# Patient Record
Sex: Male | Born: 1960 | Race: White | Hispanic: No | Marital: Married | Smoking: Never smoker
Health system: Southern US, Community
[De-identification: ages and names within clinical notes are randomized; demographics above are authoritative.]

## PROBLEM LIST (undated history)

## (undated) DIAGNOSIS — K219 Gastro-esophageal reflux disease without esophagitis: Secondary | ICD-10-CM

## (undated) DIAGNOSIS — R7989 Other specified abnormal findings of blood chemistry: Secondary | ICD-10-CM

## (undated) DIAGNOSIS — R945 Abnormal results of liver function studies: Secondary | ICD-10-CM

## (undated) DIAGNOSIS — F419 Anxiety disorder, unspecified: Secondary | ICD-10-CM

## (undated) DIAGNOSIS — M545 Low back pain, unspecified: Secondary | ICD-10-CM

## (undated) DIAGNOSIS — E349 Endocrine disorder, unspecified: Secondary | ICD-10-CM

## (undated) DIAGNOSIS — J309 Allergic rhinitis, unspecified: Secondary | ICD-10-CM

## (undated) DIAGNOSIS — M1990 Unspecified osteoarthritis, unspecified site: Secondary | ICD-10-CM

## (undated) DIAGNOSIS — J45909 Unspecified asthma, uncomplicated: Secondary | ICD-10-CM

## (undated) DIAGNOSIS — F909 Attention-deficit hyperactivity disorder, unspecified type: Secondary | ICD-10-CM

## (undated) DIAGNOSIS — M419 Scoliosis, unspecified: Secondary | ICD-10-CM

## (undated) DIAGNOSIS — E785 Hyperlipidemia, unspecified: Secondary | ICD-10-CM

## (undated) HISTORY — DX: Low back pain, unspecified: M54.50

## (undated) HISTORY — DX: Abnormal results of liver function studies: R94.5

## (undated) HISTORY — DX: Anxiety disorder, unspecified: F41.9

## (undated) HISTORY — DX: Scoliosis, unspecified: M41.9

## (undated) HISTORY — DX: Hyperlipidemia, unspecified: E78.5

## (undated) HISTORY — DX: Attention-deficit hyperactivity disorder, unspecified type: F90.9

## (undated) HISTORY — PX: FOOT SURGERY: SHX648

## (undated) HISTORY — DX: Unspecified osteoarthritis, unspecified site: M19.90

## (undated) HISTORY — DX: Allergic rhinitis, unspecified: J30.9

## (undated) HISTORY — DX: Other specified abnormal findings of blood chemistry: R79.89

## (undated) HISTORY — DX: Low back pain: M54.5

## (undated) HISTORY — DX: Endocrine disorder, unspecified: E34.9

## (undated) HISTORY — DX: Gastro-esophageal reflux disease without esophagitis: K21.9

## (undated) HISTORY — DX: Unspecified asthma, uncomplicated: J45.909

---

## 1998-05-13 ENCOUNTER — Ambulatory Visit: Admission: RE | Admit: 1998-05-13 | Discharge: 1998-05-13 | Payer: Self-pay | Admitting: Neurology

## 2000-12-23 ENCOUNTER — Encounter: Payer: Self-pay | Admitting: Pulmonary Disease

## 2000-12-23 ENCOUNTER — Ambulatory Visit (HOSPITAL_COMMUNITY): Admission: RE | Admit: 2000-12-23 | Discharge: 2000-12-23 | Payer: Self-pay | Admitting: Pulmonary Disease

## 2001-08-23 HISTORY — PX: OTHER SURGICAL HISTORY: SHX169

## 2002-06-13 ENCOUNTER — Inpatient Hospital Stay (HOSPITAL_COMMUNITY): Admission: EM | Admit: 2002-06-13 | Discharge: 2002-06-16 | Payer: Self-pay | Admitting: Psychiatry

## 2005-11-23 ENCOUNTER — Ambulatory Visit: Payer: Self-pay | Admitting: Pulmonary Disease

## 2005-11-23 ENCOUNTER — Ambulatory Visit (HOSPITAL_COMMUNITY): Admission: RE | Admit: 2005-11-23 | Discharge: 2005-11-23 | Payer: Self-pay | Admitting: Pulmonary Disease

## 2005-11-23 IMAGING — US US ABDOMEN COMPLETE
1 series · 14 of 25 positions shown · non-contrast
Comparison: none

CLINICAL DATA: Abdominal pain, nausea.
 ABDOMEN ULTRASOUND:
TECHNIQUE: Complete abdominal ultrasound examination was performed including evaluation of the liver, gallbladder, bile ducts, pancreas, kidneys, spleen, IVC, and abdominal aorta.
 No comparison.

[Series 1: unknown · 0.37mm/px · 14 of 84 slices shown]
[im 1/84]
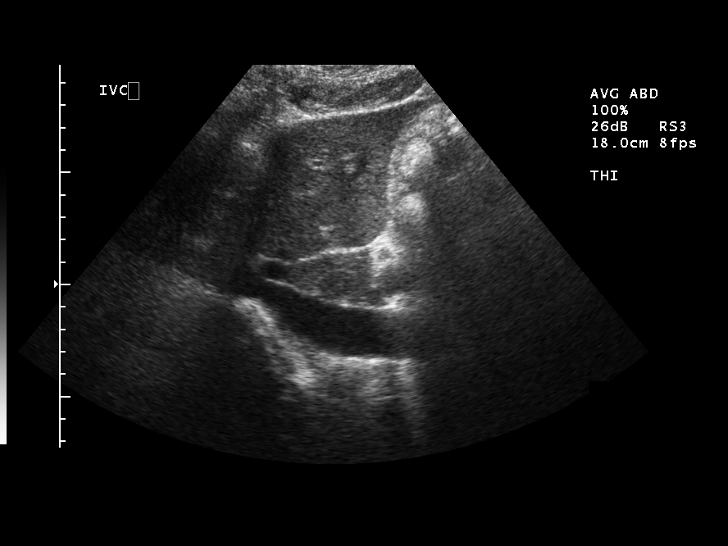
[im 7/84]
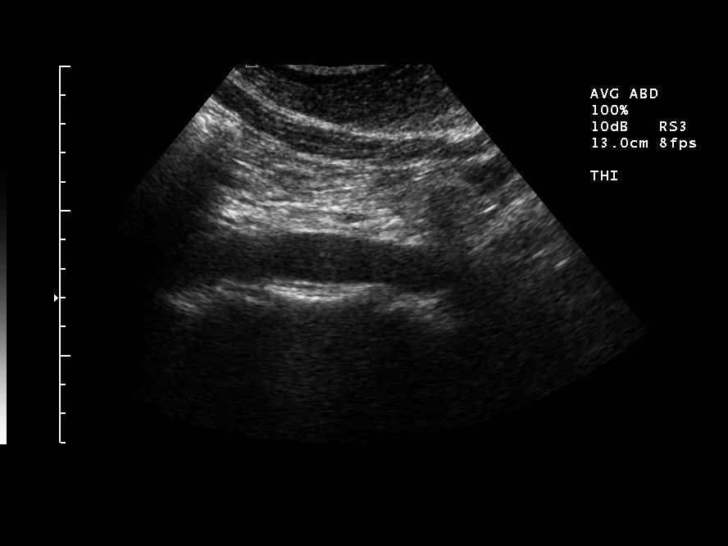
[im 14/84]
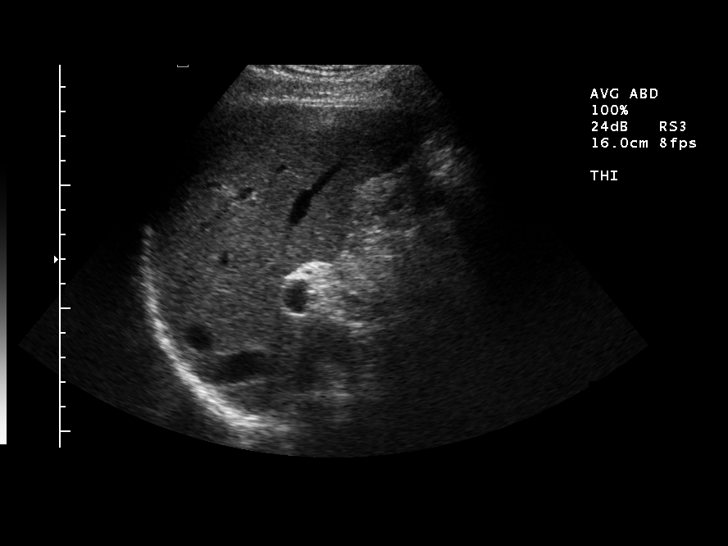
[im 21/84]
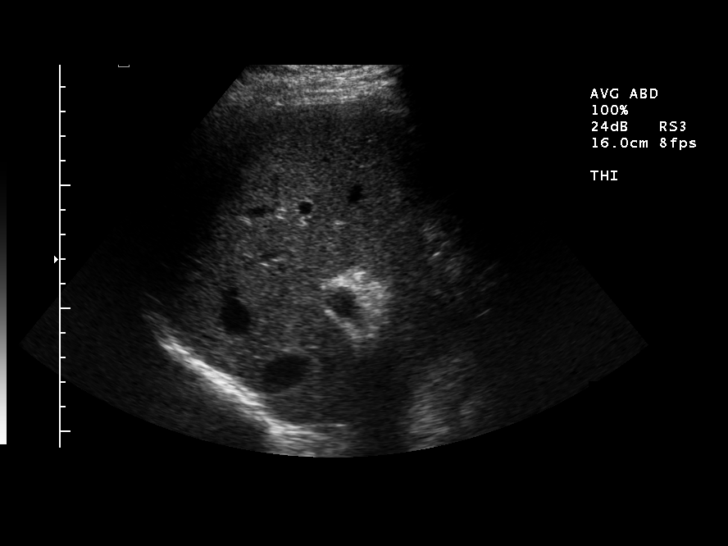
[im 28/84]
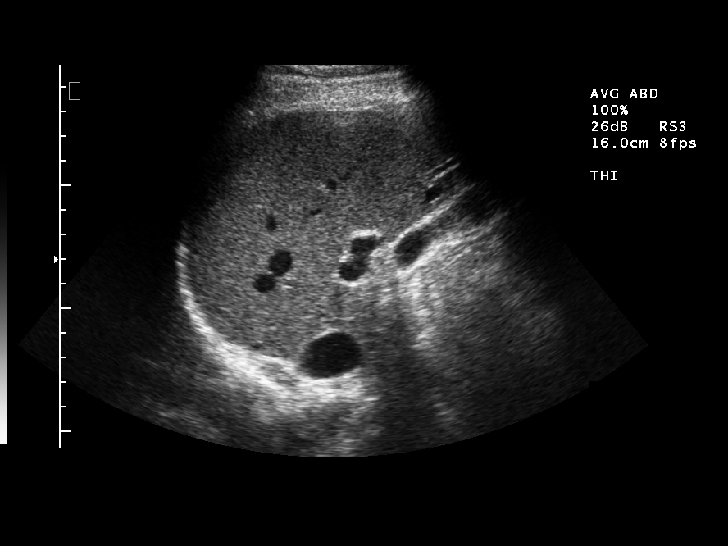
[im 32/84]
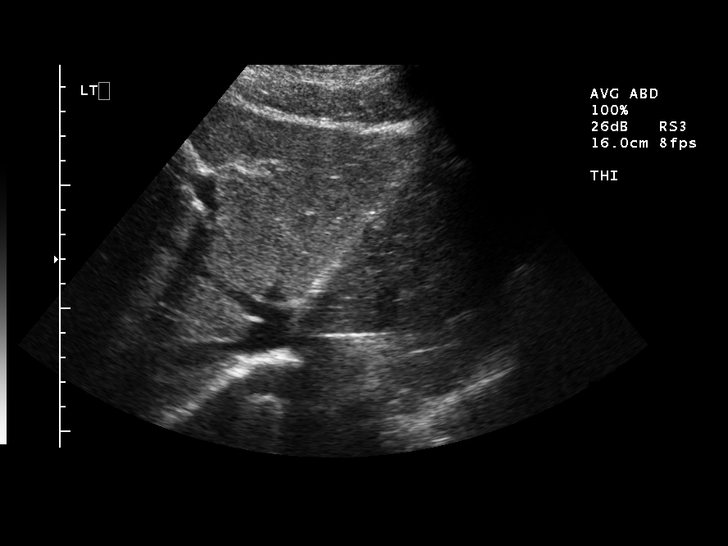
[im 39/84]
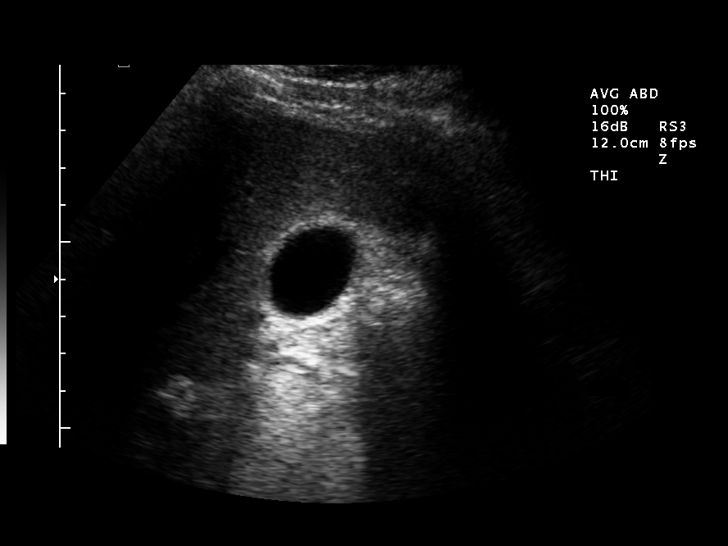
[im 45/84]
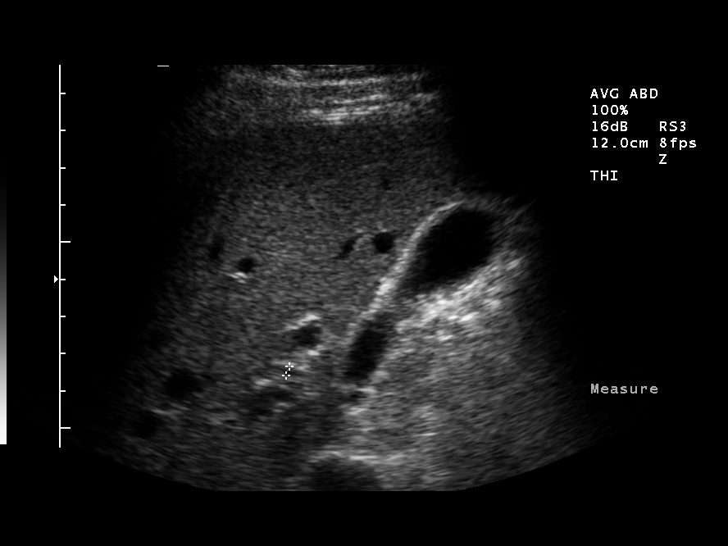
[im 52/84]
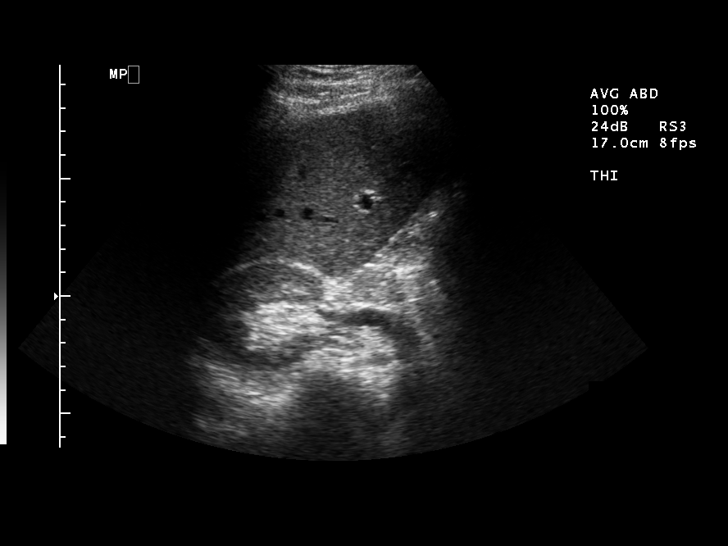
[im 56/84]
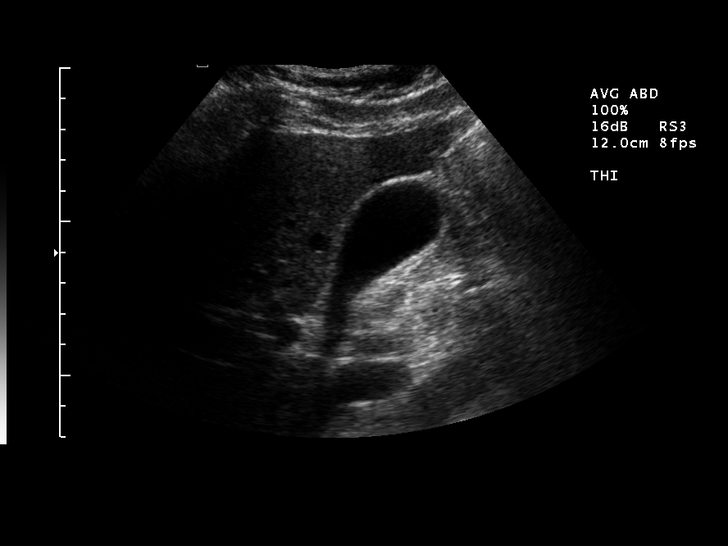
[im 63/84]
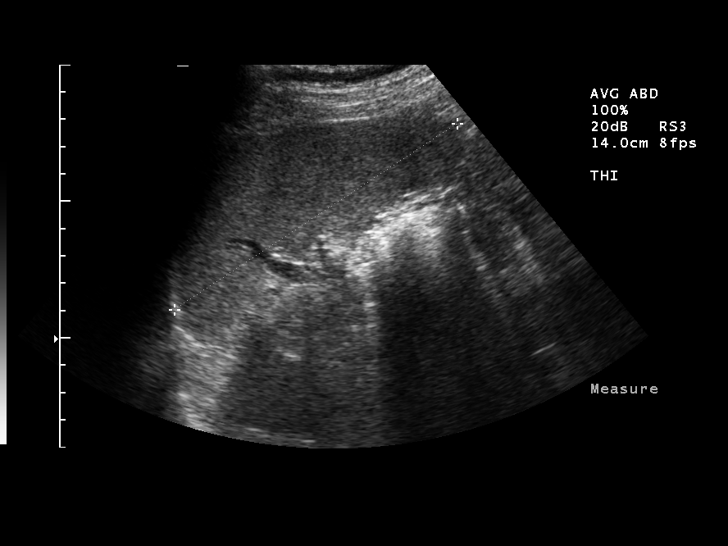
[im 70/84]
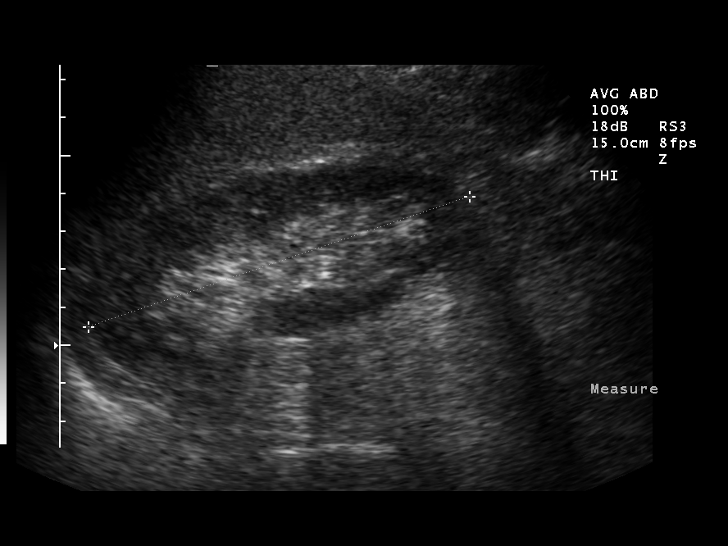
[im 77/84]
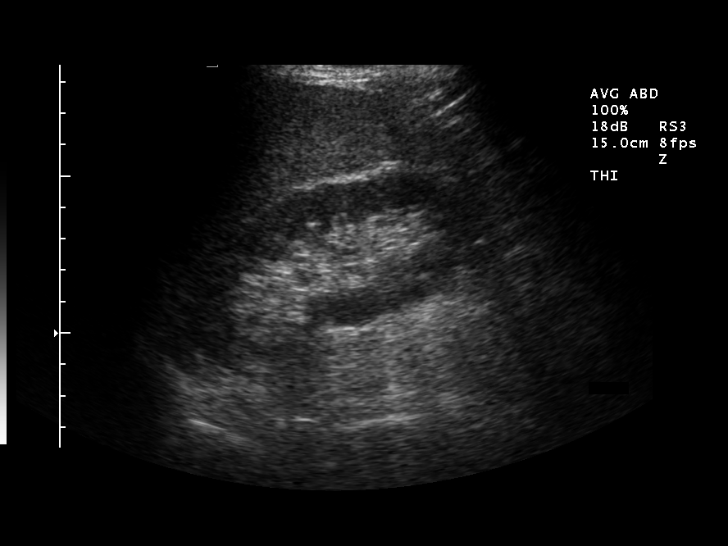
[im 84/84]
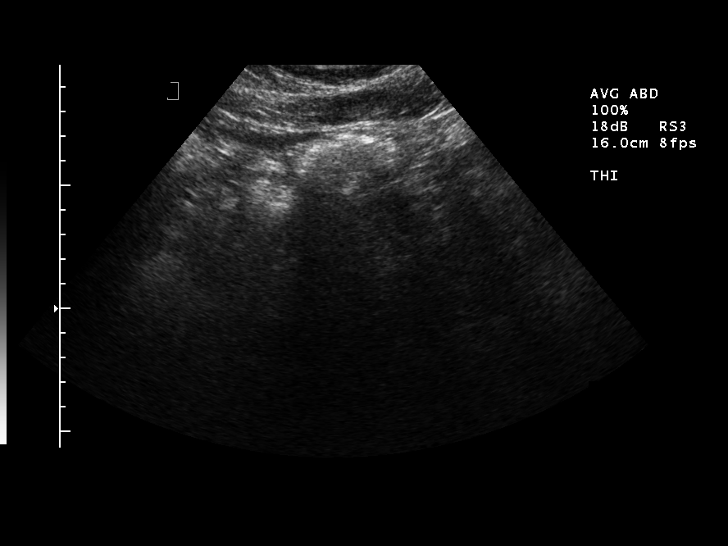

[14 of 25 positions shown; findings below may reference images not displayed]

FINDINGS: There is no evidence of gallstones or biliary ductal dilatation.  The liver is within normal limits in echogenicity, and no focal liver lesions are seen.  The visualized portions of the IVC and pancreas are unremarkable.  Visualization of the pancreas is limited due to overlying gastrointestinal gas.  
 There is no evidence of splenomegaly.  The kidneys are unremarkable, and there is no evidence of hydronephrosis.  The abdominal aorta is non-dilated.
 Gallbladder wall thickness 3 mm, common bile duct diameter 3 mm, spleen 12.4 cm long, bilateral kidneys 10.6 cm long, maximum visualized abdominal aortic diameter 2.3 cm.
IMPRESSION: 1.  Limited visualization of pancreas.
 2.  Otherwise normal.

## 2006-09-14 ENCOUNTER — Ambulatory Visit: Payer: Self-pay | Admitting: Pulmonary Disease

## 2006-11-29 ENCOUNTER — Ambulatory Visit: Payer: Self-pay | Admitting: Pulmonary Disease

## 2006-11-29 LAB — CONVERTED CEMR LAB
ALT: 90 units/L — ABNORMAL HIGH (ref 0–40)
Alkaline Phosphatase: 68 units/L (ref 39–117)
BUN: 15 mg/dL (ref 6–23)
Basophils Relative: 0.7 % (ref 0.0–1.0)
CO2: 33 meq/L — ABNORMAL HIGH (ref 19–32)
Calcium: 9.3 mg/dL (ref 8.4–10.5)
Eosinophils Absolute: 0.1 10*3/uL (ref 0.0–0.6)
GFR calc Af Amer: 103 mL/min
GFR calc non Af Amer: 85 mL/min
Lymphocytes Relative: 21.4 % (ref 12.0–46.0)
Monocytes Relative: 10.4 % (ref 3.0–11.0)
Neutro Abs: 5 10*3/uL (ref 1.4–7.7)
Platelets: 194 10*3/uL (ref 150–400)
RBC: 4.91 M/uL (ref 4.22–5.81)

## 2006-11-30 ENCOUNTER — Ambulatory Visit: Payer: Self-pay | Admitting: Internal Medicine

## 2006-11-30 IMAGING — CT CT PELVIS W/ CM
2 of 5 series · 17 of 46 positions shown, 19 images · IV contrast (APPLIED)
Comparison: None

ABDOMEN CT WITH CONTRAST

CLINICAL DATA: Abdominal pain and swelling
TECHNIQUE: Multidetector CT imaging of the abdomen and pelvis was performed
following the standard protocol during bolus administration of intravenous
contrast.

Contrast:  125 cc Omnipaque 300

[Series 2: abd_pel 5.0 b30f st · axial · 0.68mm/px · z∈[-384,-19]mm · 14 of 83 slices shown, 16 images]
[im 5/83  soft-tissue]
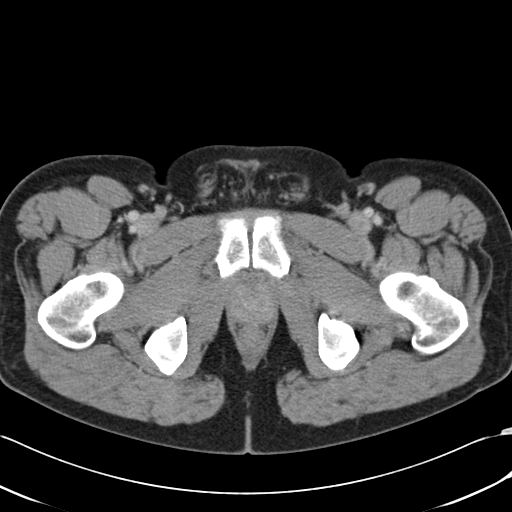
[im 5/83  bone]
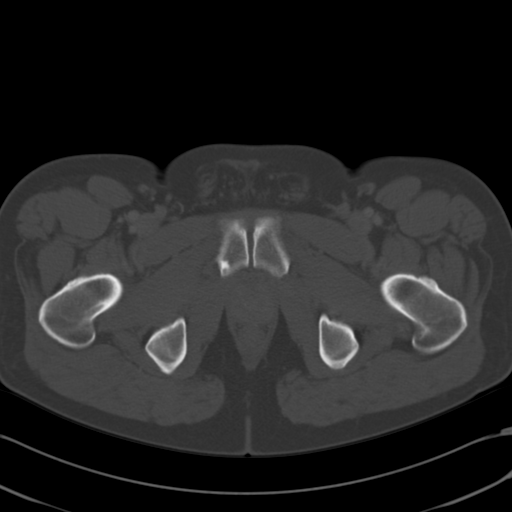
[im 9/83  soft-tissue]
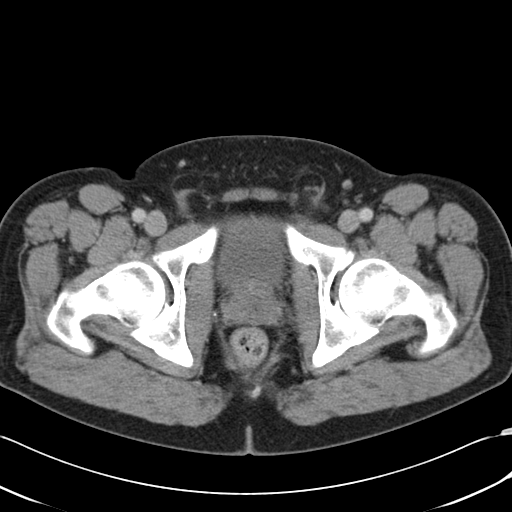
[im 18/83  soft-tissue]
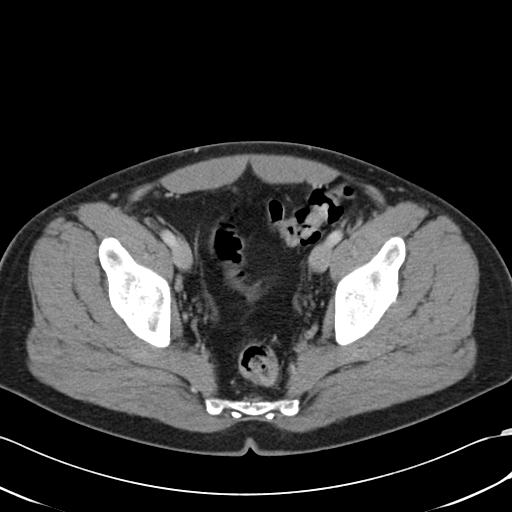
[im 22/83  soft-tissue]
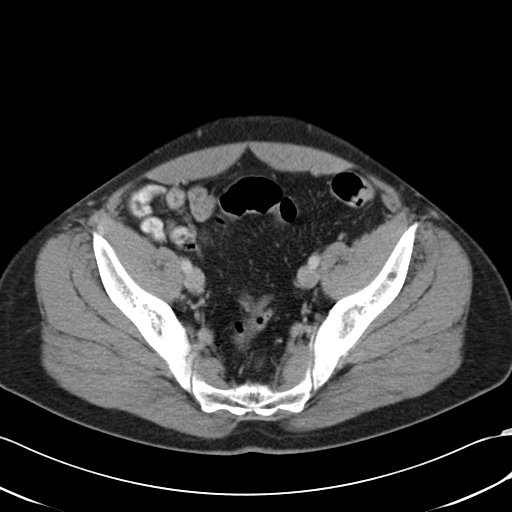
[im 26/83  soft-tissue]
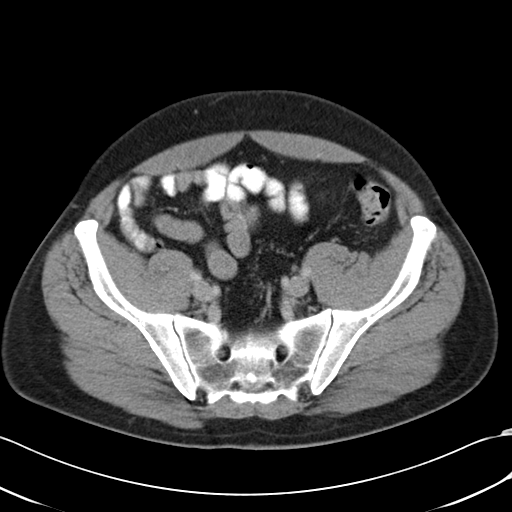
[im 35/83  soft-tissue]
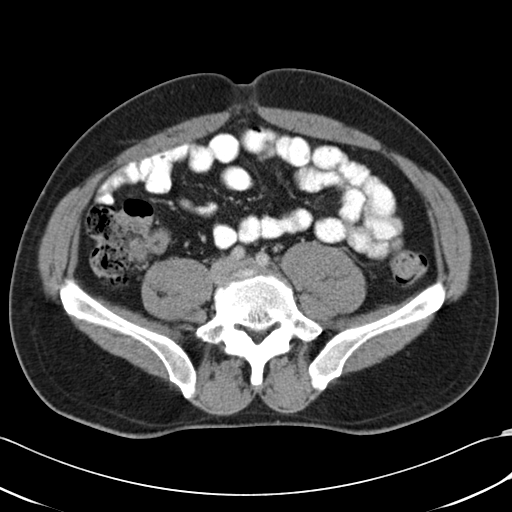
[im 39/83  soft-tissue]
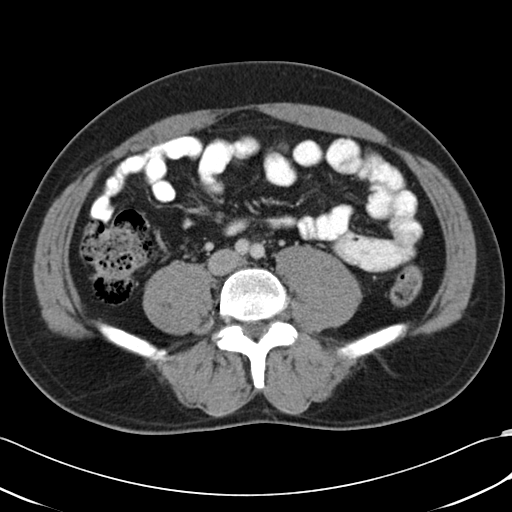
[im 44/83  soft-tissue]
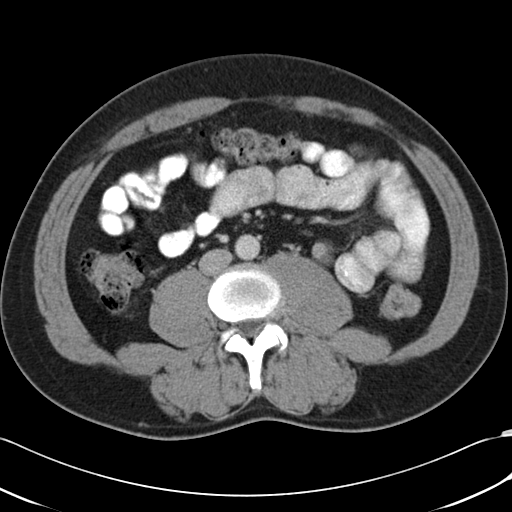
[im 48/83  soft-tissue]
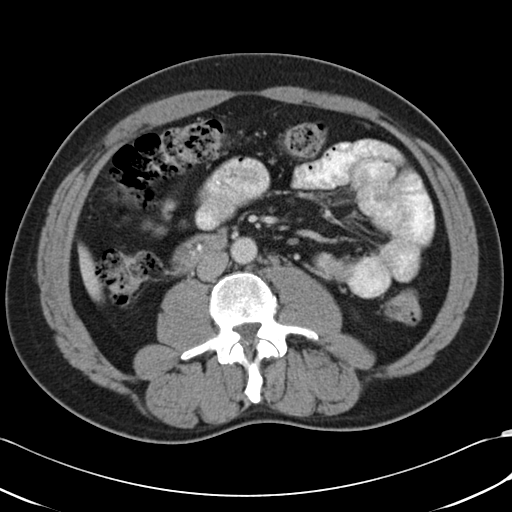
[im 48/83  bone]
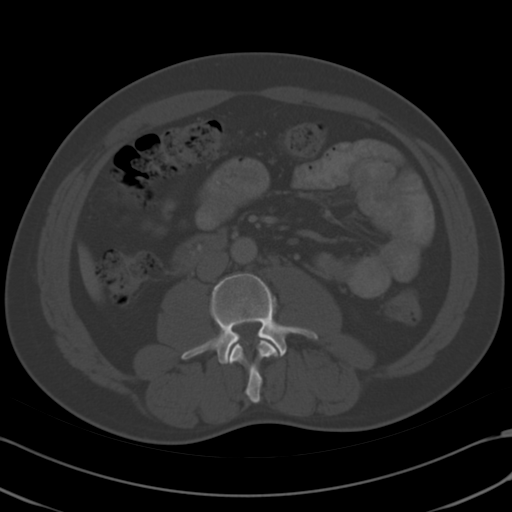
[im 57/83  soft-tissue]
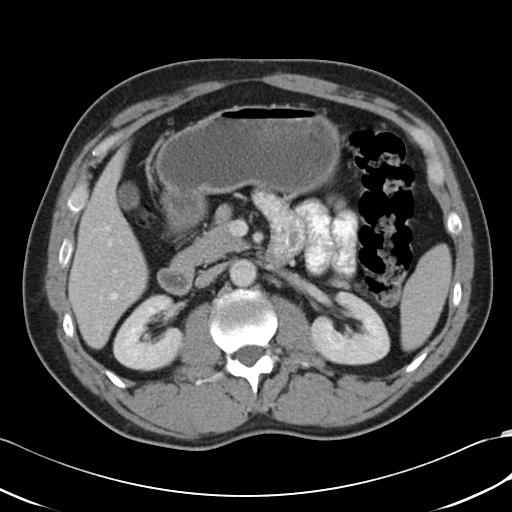
[im 61/83  soft-tissue]
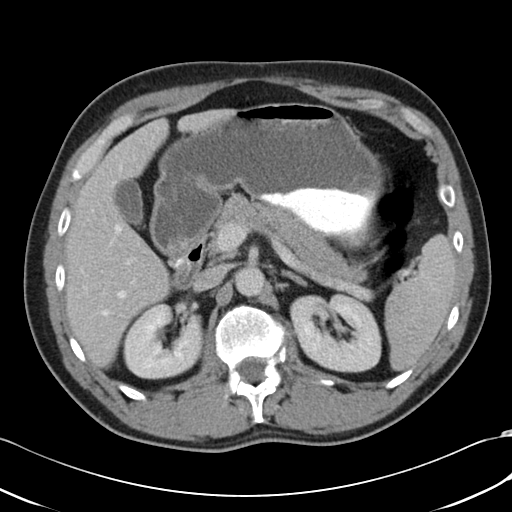
[im 65/83  soft-tissue]
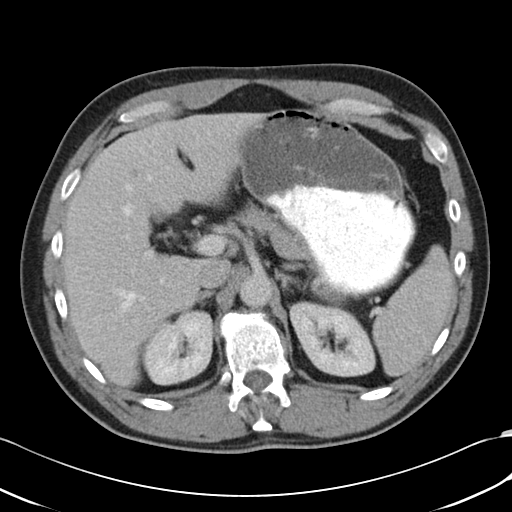
[im 74/83  soft-tissue]
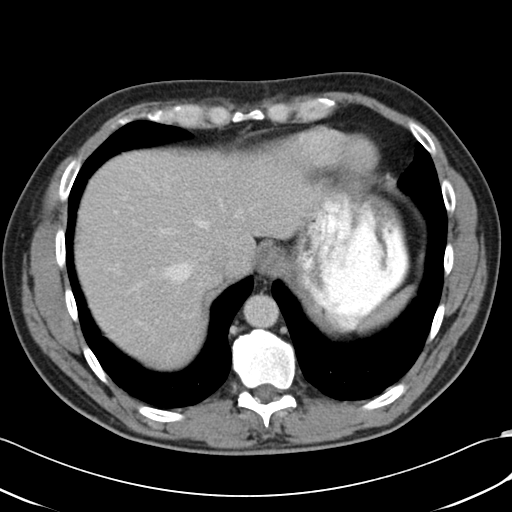
[im 78/83  soft-tissue]
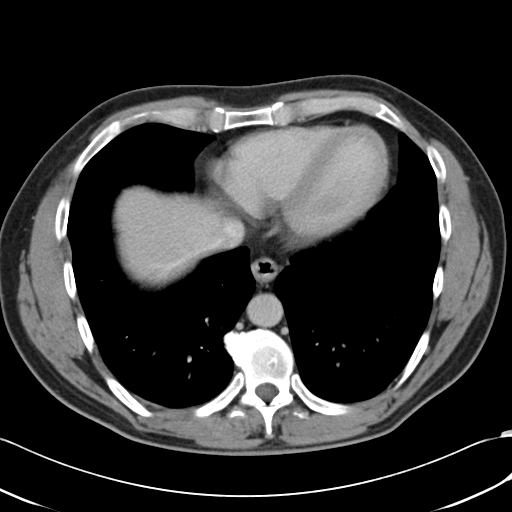

[Series 602: <mpr thick range> · coronal · 0.82mm/px · 3 of 84 slices shown]
[im 28/84  soft-tissue]
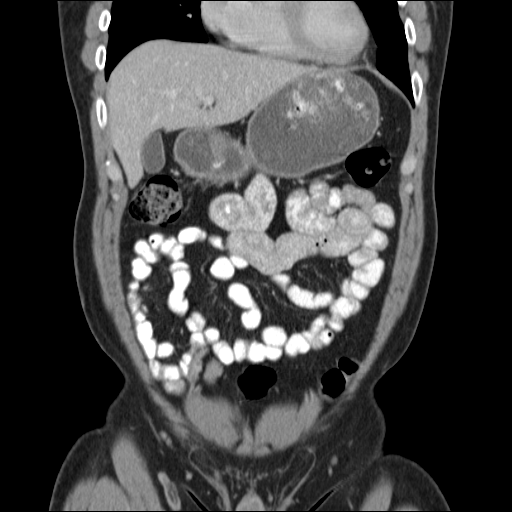
[im 37/84  soft-tissue]
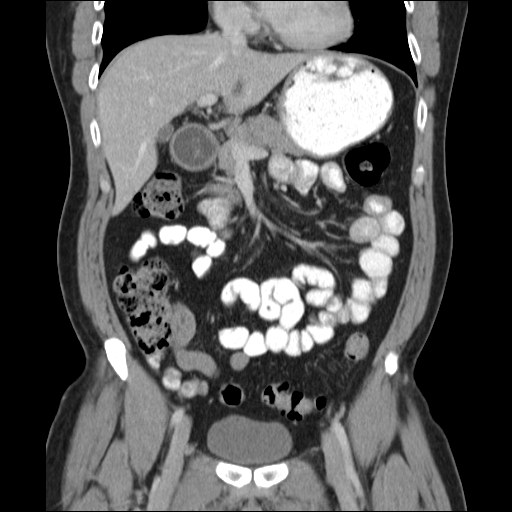
[im 47/84  soft-tissue]
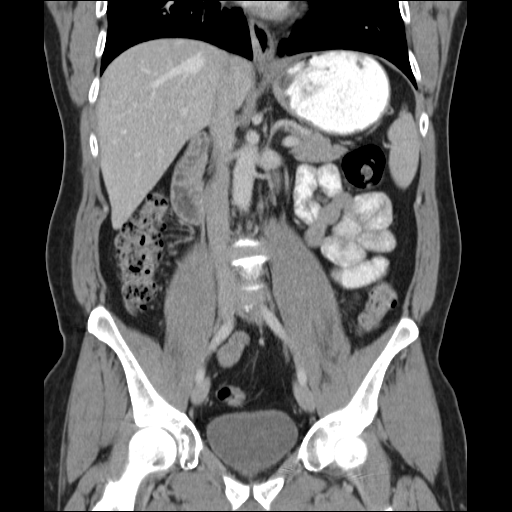

[17 of 46 positions shown; findings below may reference images not displayed]

FINDINGS: Lung bases clear.

Cyst in right lobe of liver measures 11.6 x 10.3 mm.

Negative for intrahepatic biliary ductal dilatation.

Gallbladder is normal.

Pancreas is negative.

Both adrenal glands are negative.

Spleen is negative.

Right kidney is negative. Left kidney is negative.

No free fluid.

Negative for retroperitoneal or small bowel mesenteric lymphadenopathy.

Negative for bowel obstruction.

Review of the bone windows shows lumbar spondylosis.

No lytic or sclerotic lesions are identified.

IMPRESSION

1. No acute upper abdominal CT findings.
2. Liver cyst.

PELVIS CT WITH CONTRAST
FINDINGS: No pelvic or inguinal lymphadenopathy.

Urinary bladder is negative.

Review of the bone windows shows a single sclerotic focus within the mid sacrum
measuring 6.3 mm, image 58. This is a benign appearance and likely represents a
isolated bone island. In the absence of a known malignancy, a sclerotic bone
metastasis would be considered less likely.

IMPRESSION

1. No acute pelvic CT findings.
2. No findings to explain the patient's abdominal pain and swelling

## 2006-12-08 ENCOUNTER — Ambulatory Visit: Payer: Self-pay | Admitting: Gastroenterology

## 2006-12-08 LAB — CONVERTED CEMR LAB
AST: 36 units/L (ref 0–37)
Albumin: 4.1 g/dL (ref 3.5–5.2)
Alkaline Phosphatase: 70 units/L (ref 39–117)
Ceruloplasmin: 30 mg/dL (ref 21–63)
HCV Ab: NEGATIVE
Total Bilirubin: 0.9 mg/dL (ref 0.3–1.2)

## 2006-12-23 ENCOUNTER — Ambulatory Visit: Payer: Self-pay | Admitting: Gastroenterology

## 2006-12-23 IMAGING — US US ABDOMEN COMPLETE
1 series · 14 of 25 positions shown · non-contrast
Comparison: none

ACCESSION NUMBER:  [PHONE_NUMBER]

 READING PHYSICIAN:  JANAN, M.D.
 PROCEDURE:  Multiplanar abdominal ultrasound imaging was performed in the upright, supine, right and left lateral decubitus positions.
 RESULTS:  Abdominal aorta measures 1.8 cm in maximal diameter and it appears normal.  The IVC is patent. 
 The body of the pancreas appears normal without evidence of ductal dilatation, pancreatic masses, or peripancreatic inflammation.  The head and tail of the pancreas are not adequately imaged. 
 The gallbladder wall thickness measures 2.6 mm.  Gallbladder is well distended, thin walled, with no pericholecystic fluid or intraluminal echogenic foci to suggest gallstone disease.  
 The common bile duct measures 5.3 mm in maximal diameter without evidence of intraluminal foci. 
 The liver appears normal without evidence of parenchymal lesion, ductal dilatation or vascular abnormality. 
 The right kidney measures 9 cm, the left kidney measures 10.2 cm.  Both kidneys are normal in appearance. 
 Spleen is normal in size, measuring 11.4 cm without parenchymal lesion.

[Series 1: us abdomen complete · 0.24mm/px · 14 of 58 slices shown]
[im 1/58]
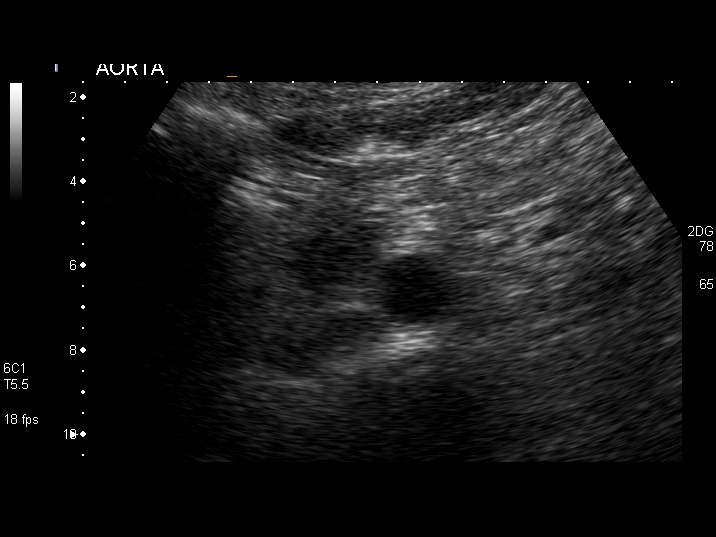
[im 5/58]
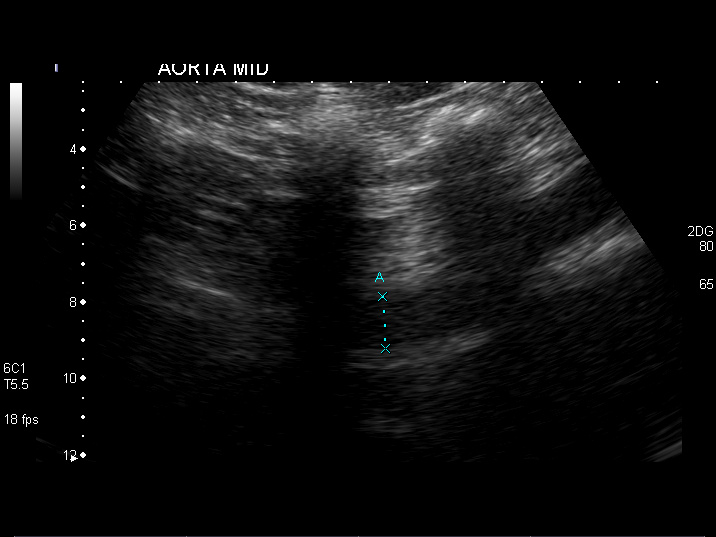
[im 10/58]
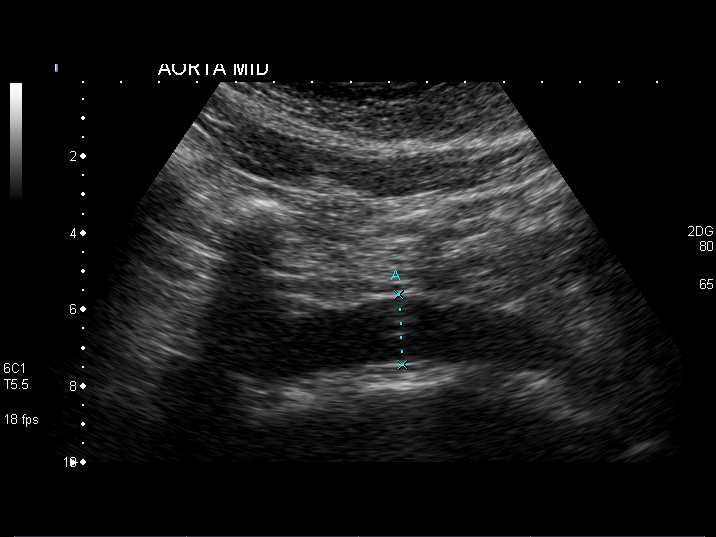
[im 15/58]
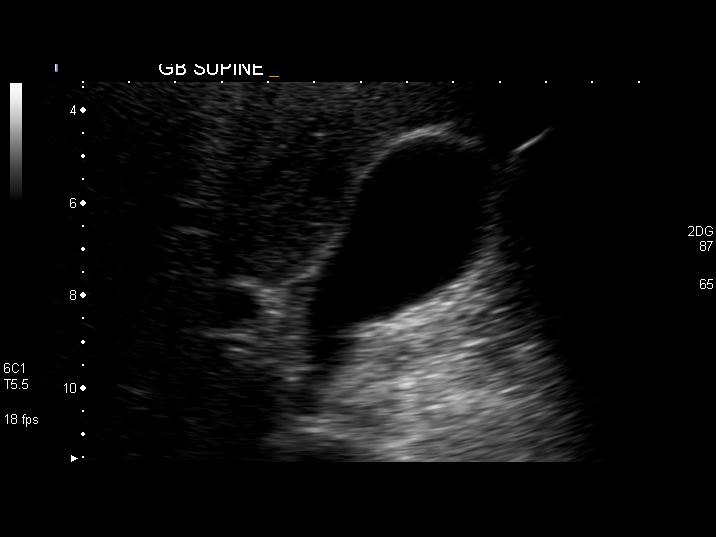
[im 20/58]
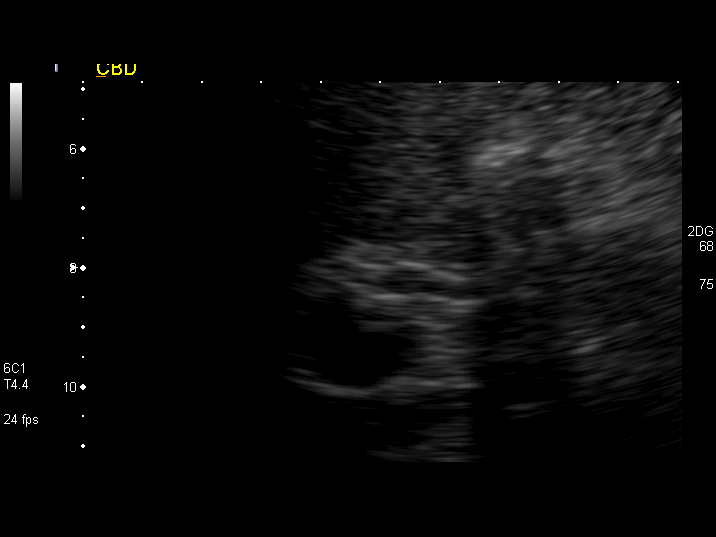
[im 22/58]
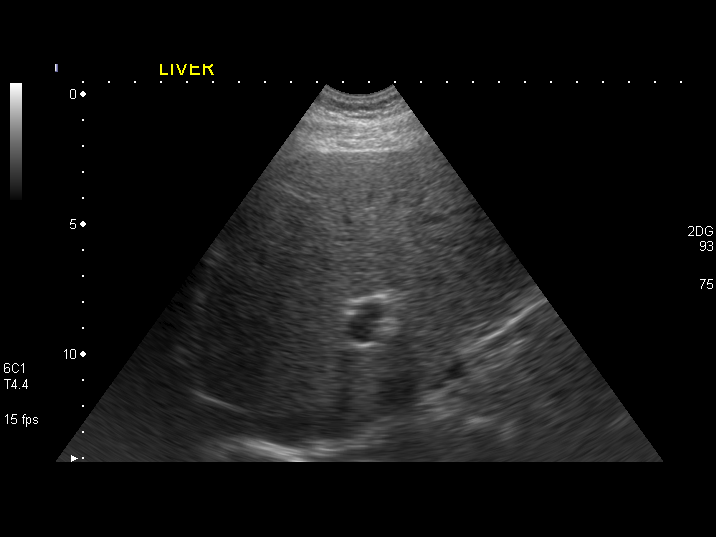
[im 27/58]
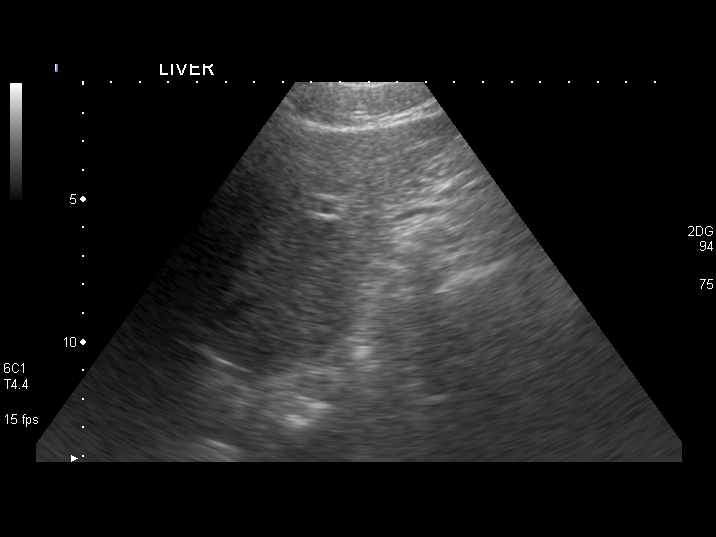
[im 31/58]
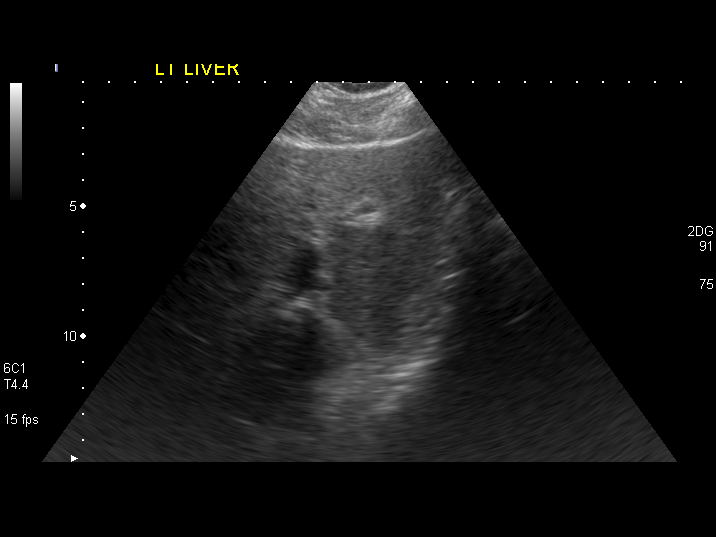
[im 36/58]
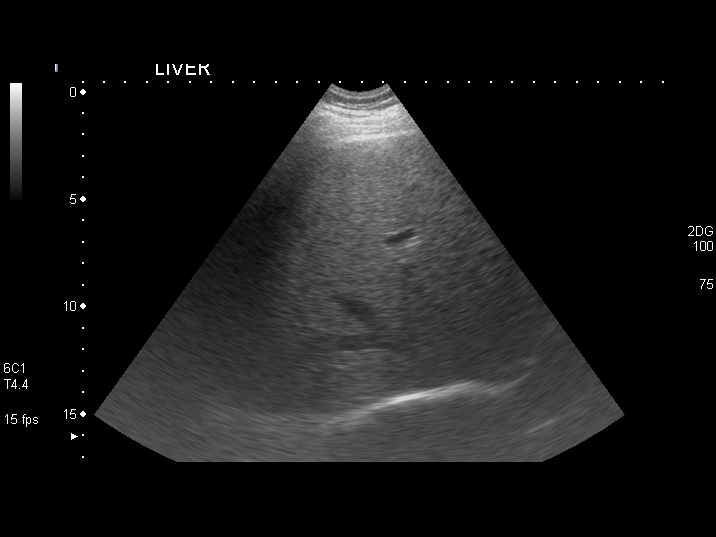
[im 39/58]
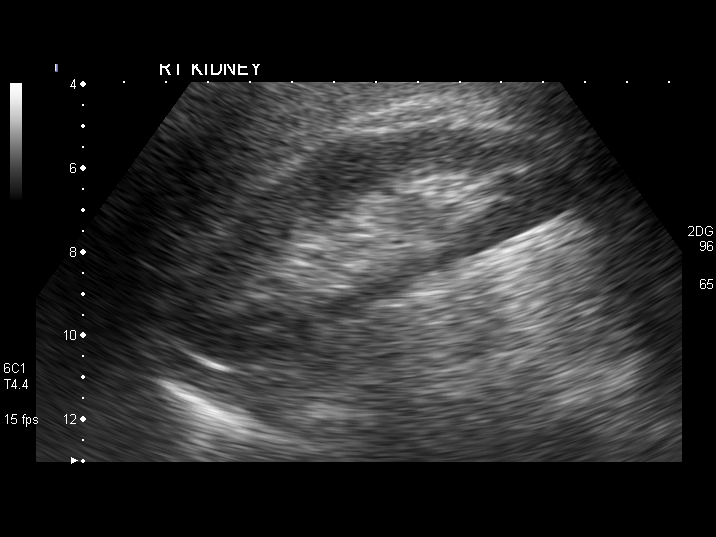
[im 43/58]
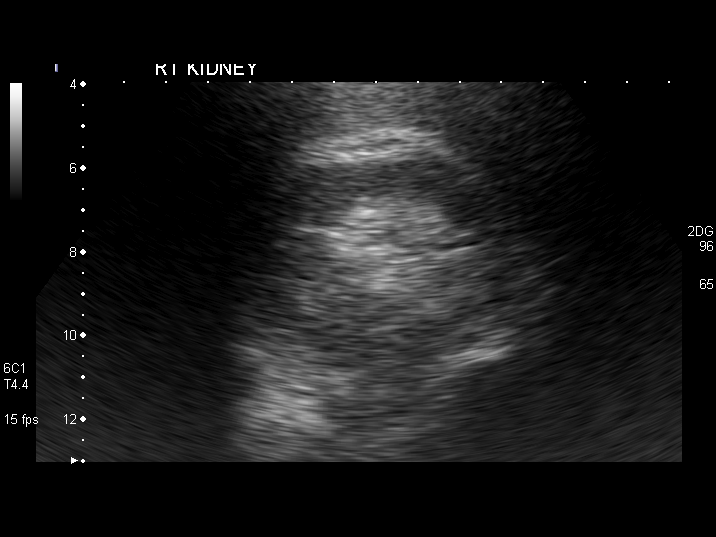
[im 48/58]
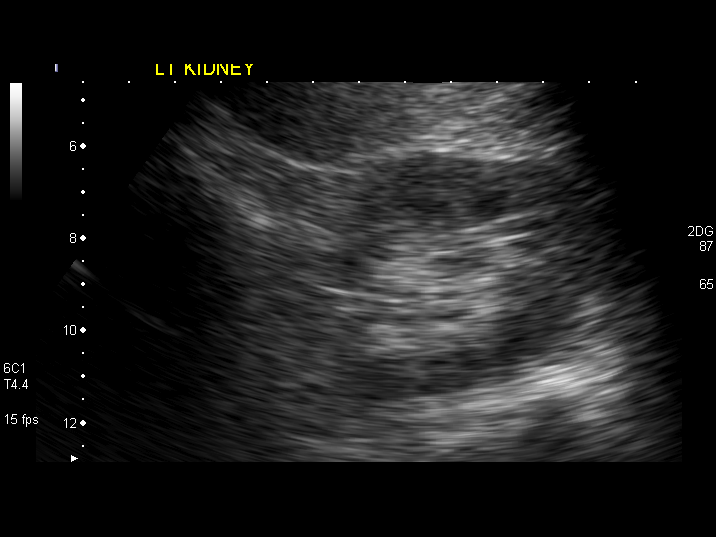
[im 53/58]
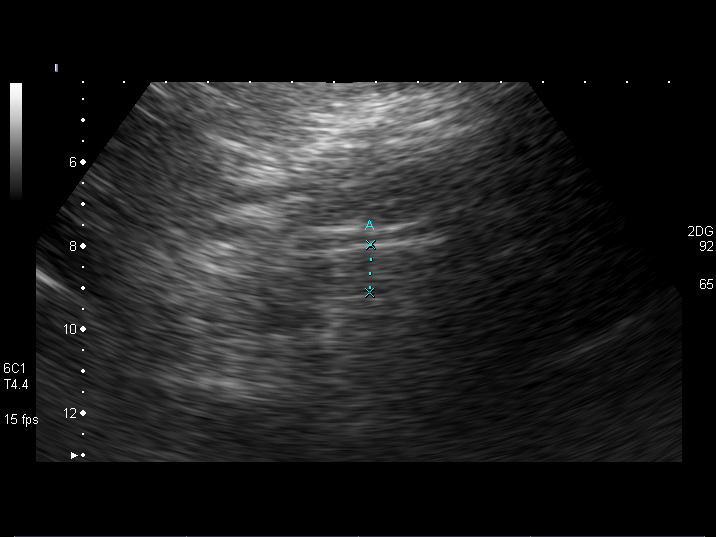
[im 58/58]
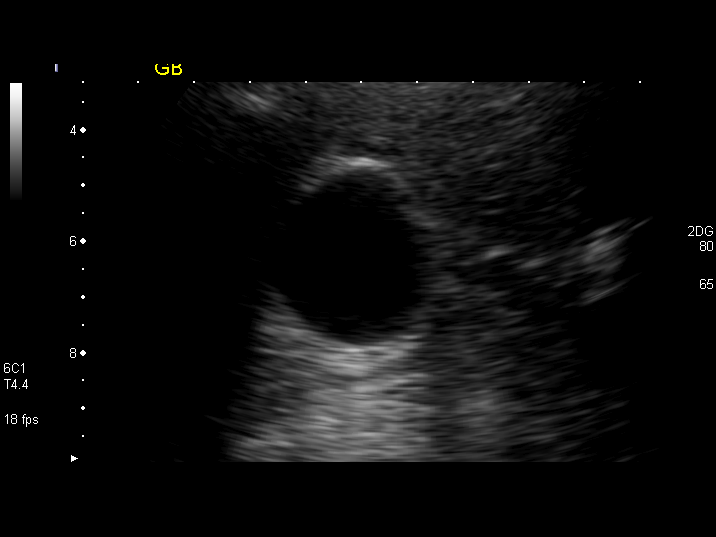

[14 of 25 positions shown; findings below may reference images not displayed]

IMPRESSION: 1.         No abnormalities noted.
 2.         Limited pancreatic imaging.

## 2006-12-28 ENCOUNTER — Ambulatory Visit: Payer: Self-pay | Admitting: Gastroenterology

## 2006-12-28 LAB — CONVERTED CEMR LAB
Albumin: 3.7 g/dL (ref 3.5–5.2)
Alkaline Phosphatase: 72 units/L (ref 39–117)

## 2007-04-26 ENCOUNTER — Ambulatory Visit: Payer: Self-pay | Admitting: Pulmonary Disease

## 2007-04-26 LAB — CONVERTED CEMR LAB
ALT: 45 units/L (ref 0–53)
Albumin: 4 g/dL (ref 3.5–5.2)
Alkaline Phosphatase: 64 units/L (ref 39–117)
BUN: 14 mg/dL (ref 6–23)
Basophils Relative: 2 % — ABNORMAL HIGH (ref 0.0–1.0)
Calcium: 9.3 mg/dL (ref 8.4–10.5)
Direct LDL: 158.8 mg/dL
Eosinophils Absolute: 0.1 10*3/uL (ref 0.0–0.6)
Eosinophils Relative: 2.2 % (ref 0.0–5.0)
GFR calc Af Amer: 116 mL/min
GFR calc non Af Amer: 96 mL/min
HDL: 40.4 mg/dL (ref >39.0)
Ketones, ur: NEGATIVE mg/dL
Leukocytes, UA: NEGATIVE
MCV: 90 fL (ref 78.0–100.0)
Mucus, UA: NEGATIVE
Platelets: 187 10*3/uL (ref 150–400)
Potassium: 4.6 meq/L (ref 3.5–5.1)
RBC: 4.95 M/uL (ref 4.22–5.81)
Specific Gravity, Urine: 1.02 (ref 1.000–1.03)
TSH: 1.33 microintl units/mL (ref 0.35–5.50)
Triglycerides: 164 mg/dL — ABNORMAL HIGH (ref 0–149)
VLDL: 33 mg/dL (ref 0–40)
WBC: 4.9 10*3/uL (ref 4.5–10.5)
pH: 6.5 (ref 5.0–8.0)

## 2007-04-26 IMAGING — CR DG CHEST 2V
3 series · 3 of 3 positions shown · non-contrast
Comparison: [DATE].

CLINICAL DATA: Chest pain. 
 CHEST ? 2 VIEW:

[view not recorded (1 of 3)]
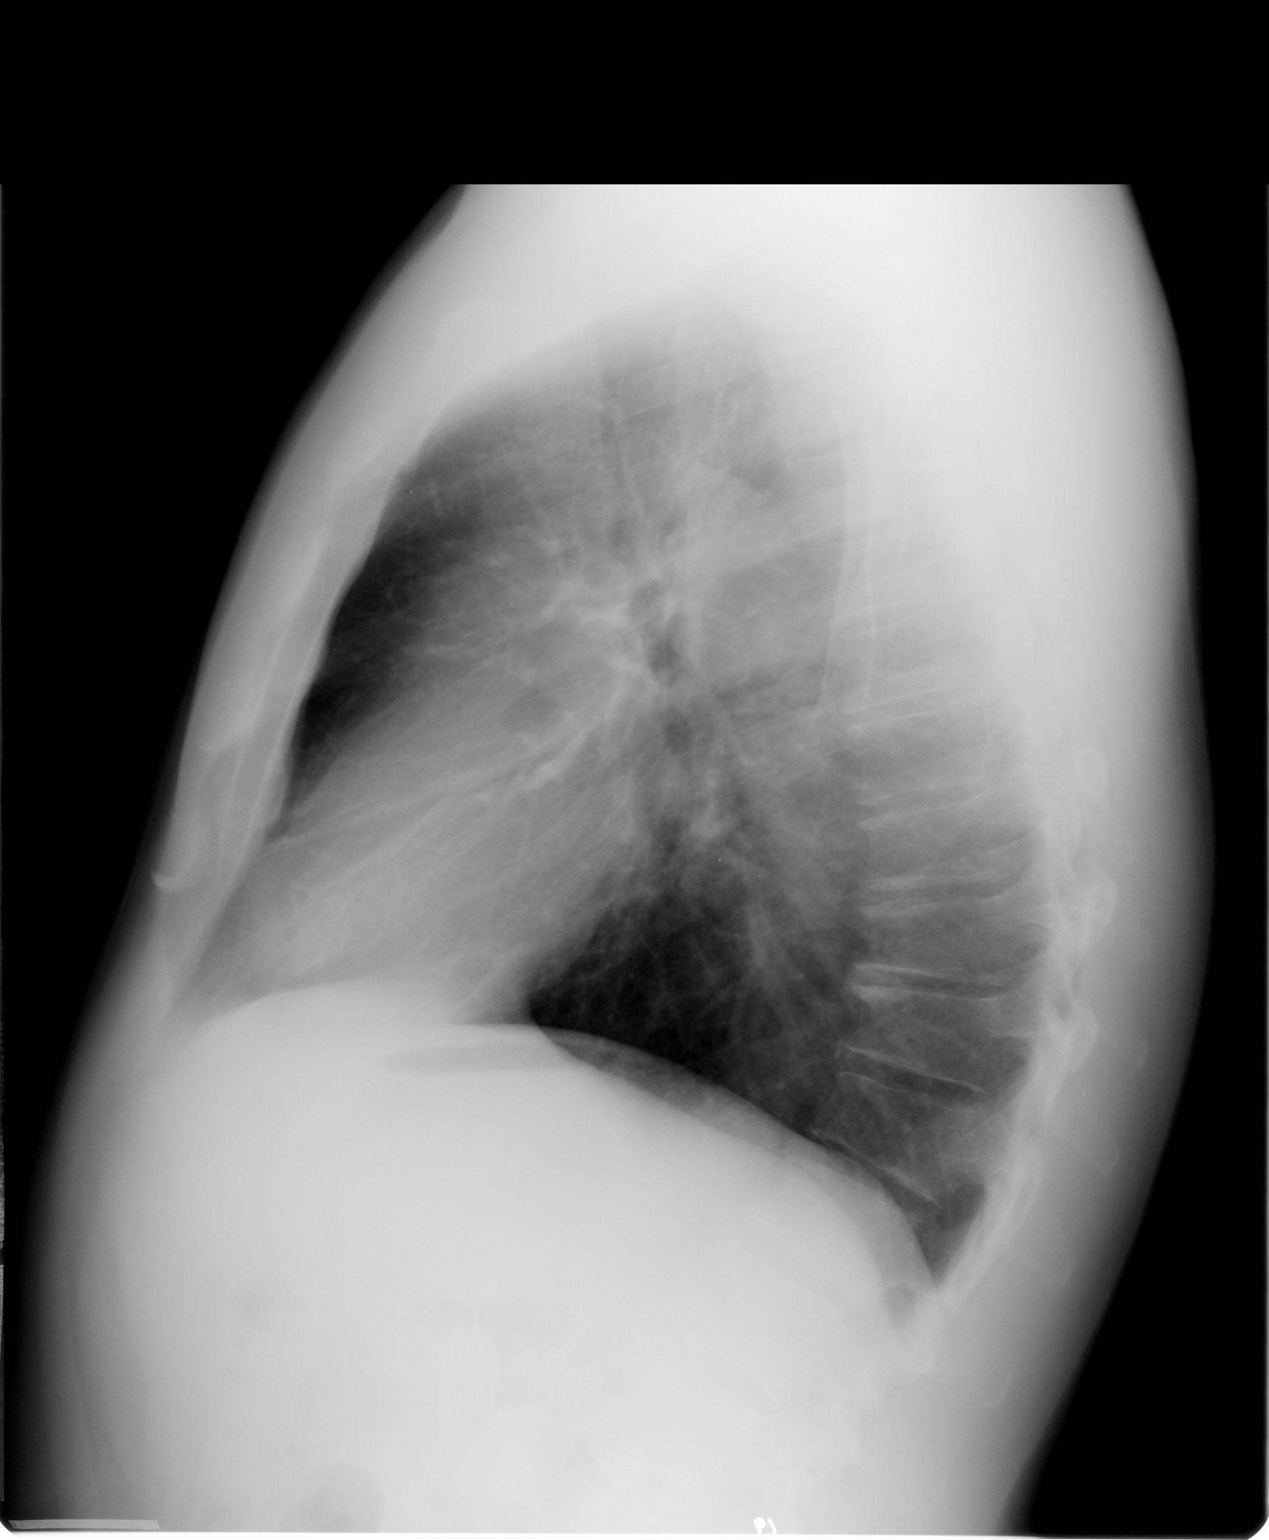

[view not recorded (2 of 3)]
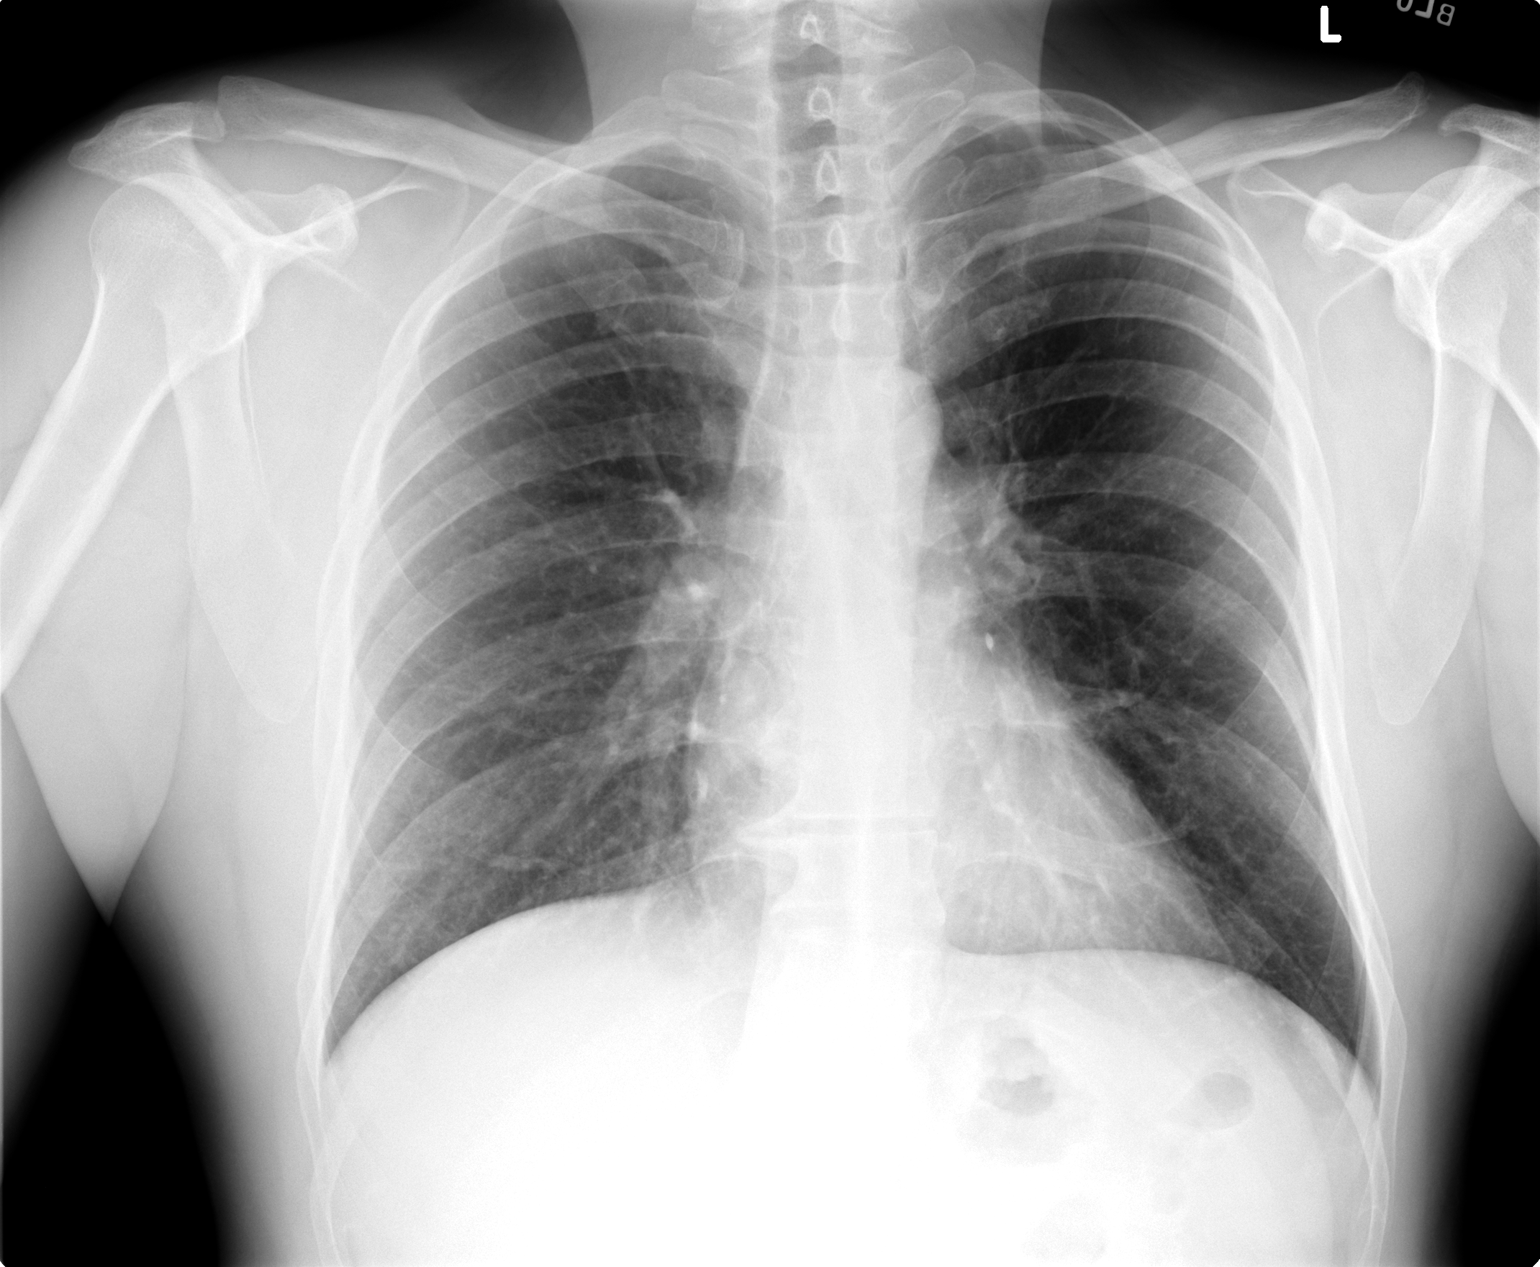

[view not recorded (3 of 3)]
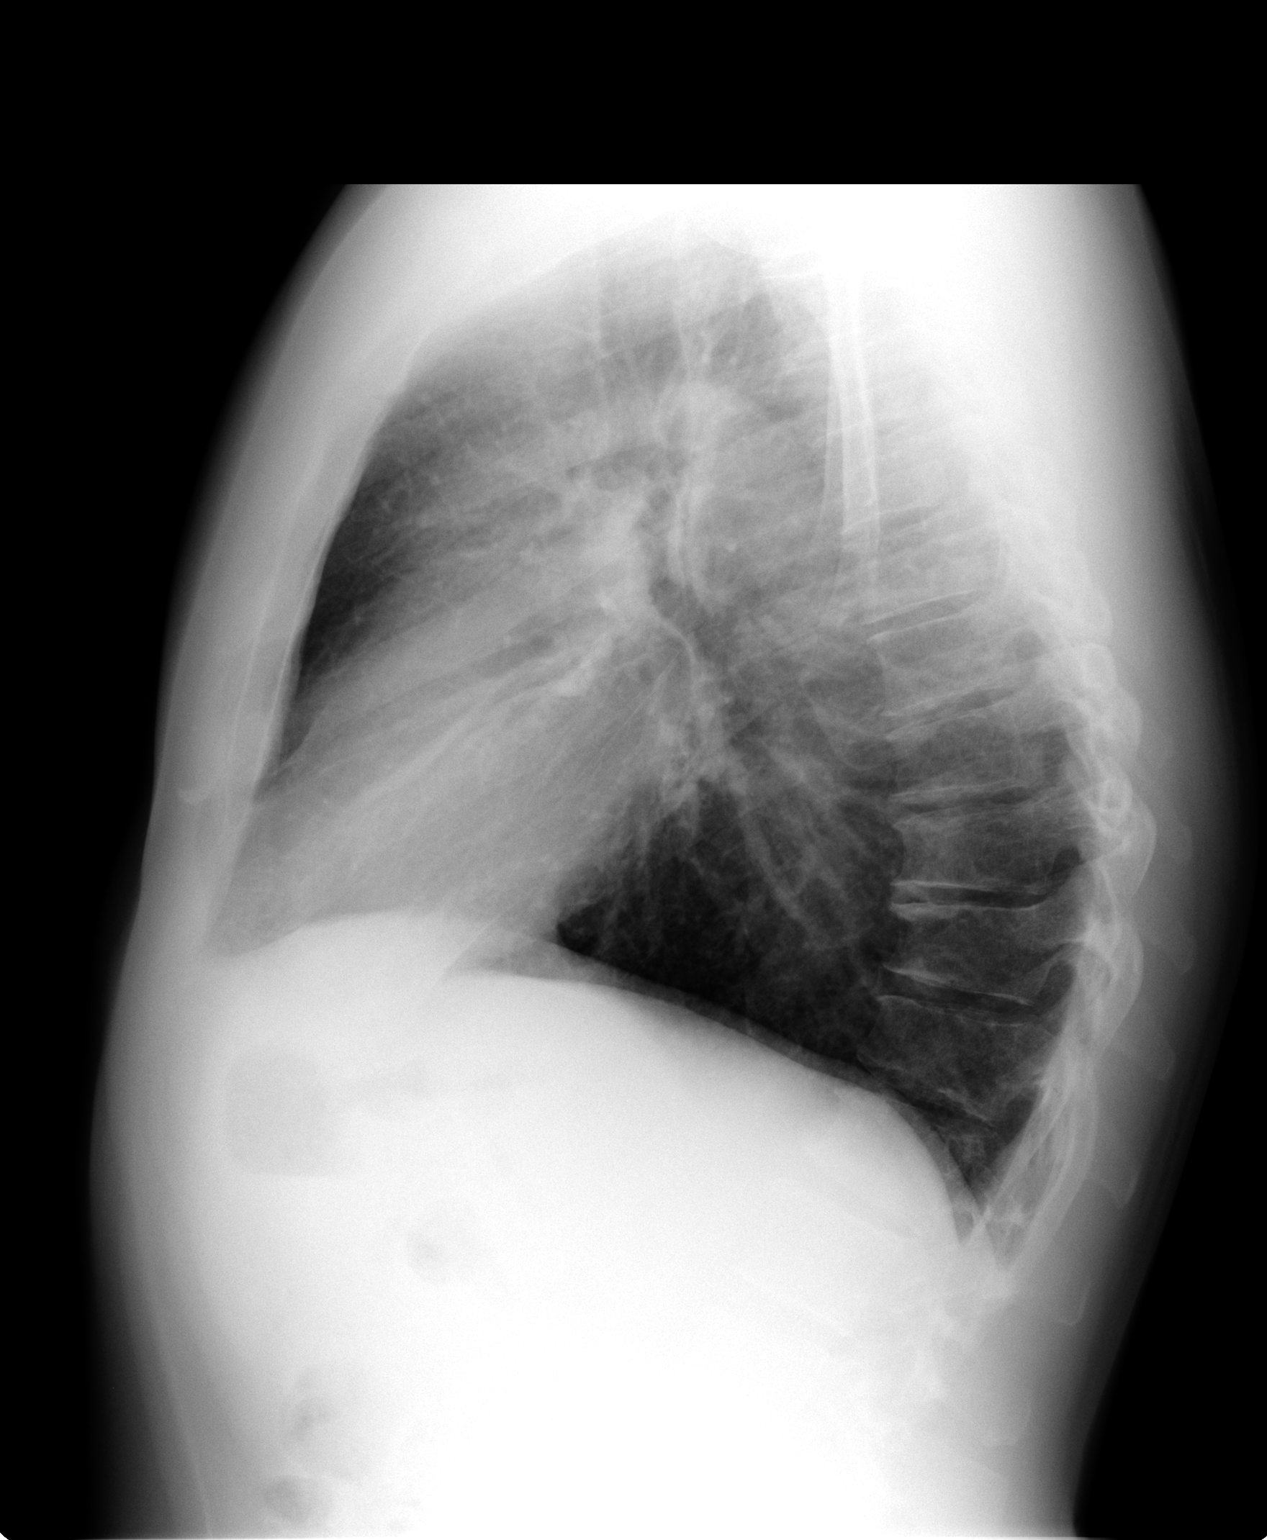

[3 of 3 positions shown; findings below may reference images not displayed]

FINDINGS: Trachea is midline.  Heart size stable.  Lungs are clear.  No pleural fluid.
IMPRESSION: No acute findings.

## 2007-10-02 ENCOUNTER — Encounter: Payer: Self-pay | Admitting: Adult Health

## 2007-10-02 ENCOUNTER — Ambulatory Visit: Payer: Self-pay | Admitting: Pulmonary Disease

## 2007-10-02 DIAGNOSIS — F411 Generalized anxiety disorder: Secondary | ICD-10-CM | POA: Insufficient documentation

## 2007-10-02 DIAGNOSIS — K219 Gastro-esophageal reflux disease without esophagitis: Secondary | ICD-10-CM | POA: Insufficient documentation

## 2007-10-02 DIAGNOSIS — E785 Hyperlipidemia, unspecified: Secondary | ICD-10-CM | POA: Insufficient documentation

## 2007-10-03 LAB — CONVERTED CEMR LAB
Bilirubin Urine: NEGATIVE
Influenza B Ag: NEGATIVE
Ketones, ur: NEGATIVE mg/dL
Leukocytes, UA: NEGATIVE
Specific Gravity, Urine: 1.025 (ref 1.000–1.03)
pH: 6 (ref 5.0–8.0)

## 2007-10-05 ENCOUNTER — Encounter: Payer: Self-pay | Admitting: Adult Health

## 2008-05-09 ENCOUNTER — Ambulatory Visit: Payer: Self-pay | Admitting: Pulmonary Disease

## 2008-07-11 ENCOUNTER — Encounter: Payer: Self-pay | Admitting: Pulmonary Disease

## 2008-07-25 ENCOUNTER — Encounter: Payer: Self-pay | Admitting: Pulmonary Disease

## 2008-07-26 ENCOUNTER — Encounter: Payer: Self-pay | Admitting: Pulmonary Disease

## 2008-08-05 ENCOUNTER — Telehealth (INDEPENDENT_AMBULATORY_CARE_PROVIDER_SITE_OTHER): Payer: Self-pay | Admitting: *Deleted

## 2008-08-12 ENCOUNTER — Ambulatory Visit: Payer: Self-pay | Admitting: Pulmonary Disease

## 2008-08-12 DIAGNOSIS — Z8639 Personal history of other endocrine, nutritional and metabolic disease: Secondary | ICD-10-CM

## 2008-08-12 DIAGNOSIS — M1990 Unspecified osteoarthritis, unspecified site: Secondary | ICD-10-CM | POA: Insufficient documentation

## 2008-08-12 DIAGNOSIS — M545 Low back pain, unspecified: Secondary | ICD-10-CM | POA: Insufficient documentation

## 2008-08-12 DIAGNOSIS — E291 Testicular hypofunction: Secondary | ICD-10-CM | POA: Insufficient documentation

## 2008-08-12 DIAGNOSIS — J209 Acute bronchitis, unspecified: Secondary | ICD-10-CM | POA: Insufficient documentation

## 2008-08-12 DIAGNOSIS — F909 Attention-deficit hyperactivity disorder, unspecified type: Secondary | ICD-10-CM | POA: Insufficient documentation

## 2008-08-12 DIAGNOSIS — Z862 Personal history of diseases of the blood and blood-forming organs and certain disorders involving the immune mechanism: Secondary | ICD-10-CM | POA: Insufficient documentation

## 2008-08-19 LAB — CONVERTED CEMR LAB
ALT: 35 units/L (ref 0–53)
Albumin: 4 g/dL (ref 3.5–5.2)
BUN: 18 mg/dL (ref 6–23)
Basophils Relative: 0.4 % (ref 0.0–3.0)
Bilirubin, Direct: 0.1 mg/dL (ref 0.0–0.3)
CO2: 30 meq/L (ref 19–32)
Calcium: 9 mg/dL (ref 8.4–10.5)
Cholesterol: 250 mg/dL (ref 0–200)
Creatinine, Ser: 1 mg/dL (ref 0.4–1.5)
Direct LDL: 151 mg/dL
Eosinophils Relative: 2.3 % (ref 0.0–5.0)
GFR calc Af Amer: 103 mL/min
Glucose, Bld: 111 mg/dL — ABNORMAL HIGH (ref 70–99)
HCT: 41.8 % (ref 39.0–52.0)
Hemoglobin: 15 g/dL (ref 13.0–17.0)
Lymphocytes Relative: 32.9 % (ref 12.0–46.0)
Monocytes Absolute: 0.7 10*3/uL (ref 0.1–1.0)
Monocytes Relative: 13.6 % — ABNORMAL HIGH (ref 3.0–12.0)
Neutro Abs: 2.4 10*3/uL (ref 1.4–7.7)
RBC: 4.57 M/uL (ref 4.22–5.81)
RDW: 11.5 % (ref 11.5–14.6)
Sodium: 140 meq/L (ref 135–145)
TSH: 1.37 microintl units/mL (ref 0.35–5.50)
Total CHOL/HDL Ratio: 5.7
Total Protein: 6.5 g/dL (ref 6.0–8.3)
VLDL: 40 mg/dL (ref 0–40)

## 2008-08-22 ENCOUNTER — Telehealth: Payer: Self-pay | Admitting: Pulmonary Disease

## 2008-09-12 ENCOUNTER — Ambulatory Visit: Payer: Self-pay | Admitting: Pulmonary Disease

## 2008-10-08 ENCOUNTER — Telehealth (INDEPENDENT_AMBULATORY_CARE_PROVIDER_SITE_OTHER): Payer: Self-pay | Admitting: *Deleted

## 2008-10-10 ENCOUNTER — Ambulatory Visit: Payer: Self-pay | Admitting: Adult Health

## 2008-10-25 ENCOUNTER — Ambulatory Visit: Payer: Self-pay | Admitting: Pulmonary Disease

## 2008-10-25 IMAGING — CR DG CHEST 2V
2 series · 2 of 2 positions shown · non-contrast
Comparison: [DATE]

CLINICAL DATA: Chest pain, shortness of breath and cough.

CHEST - 2 VIEW

[view not recorded (1 of 2)]
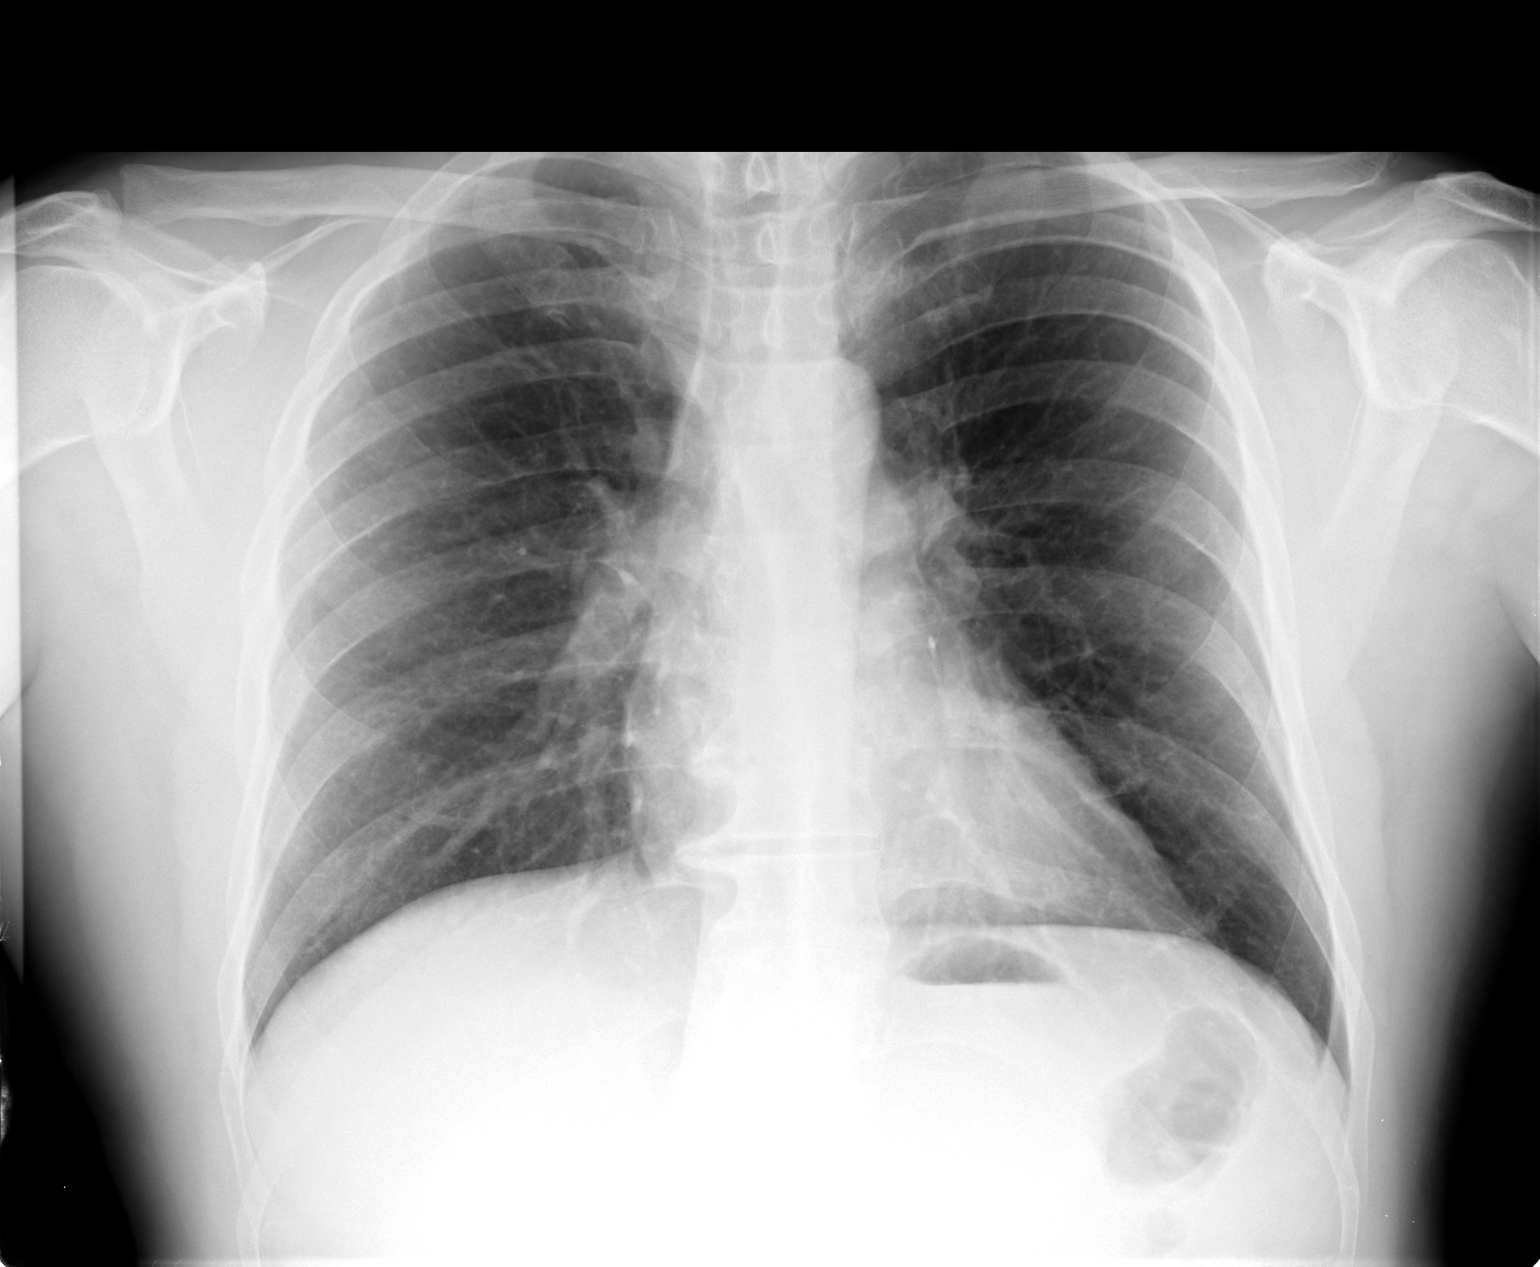

[view not recorded (2 of 2)]
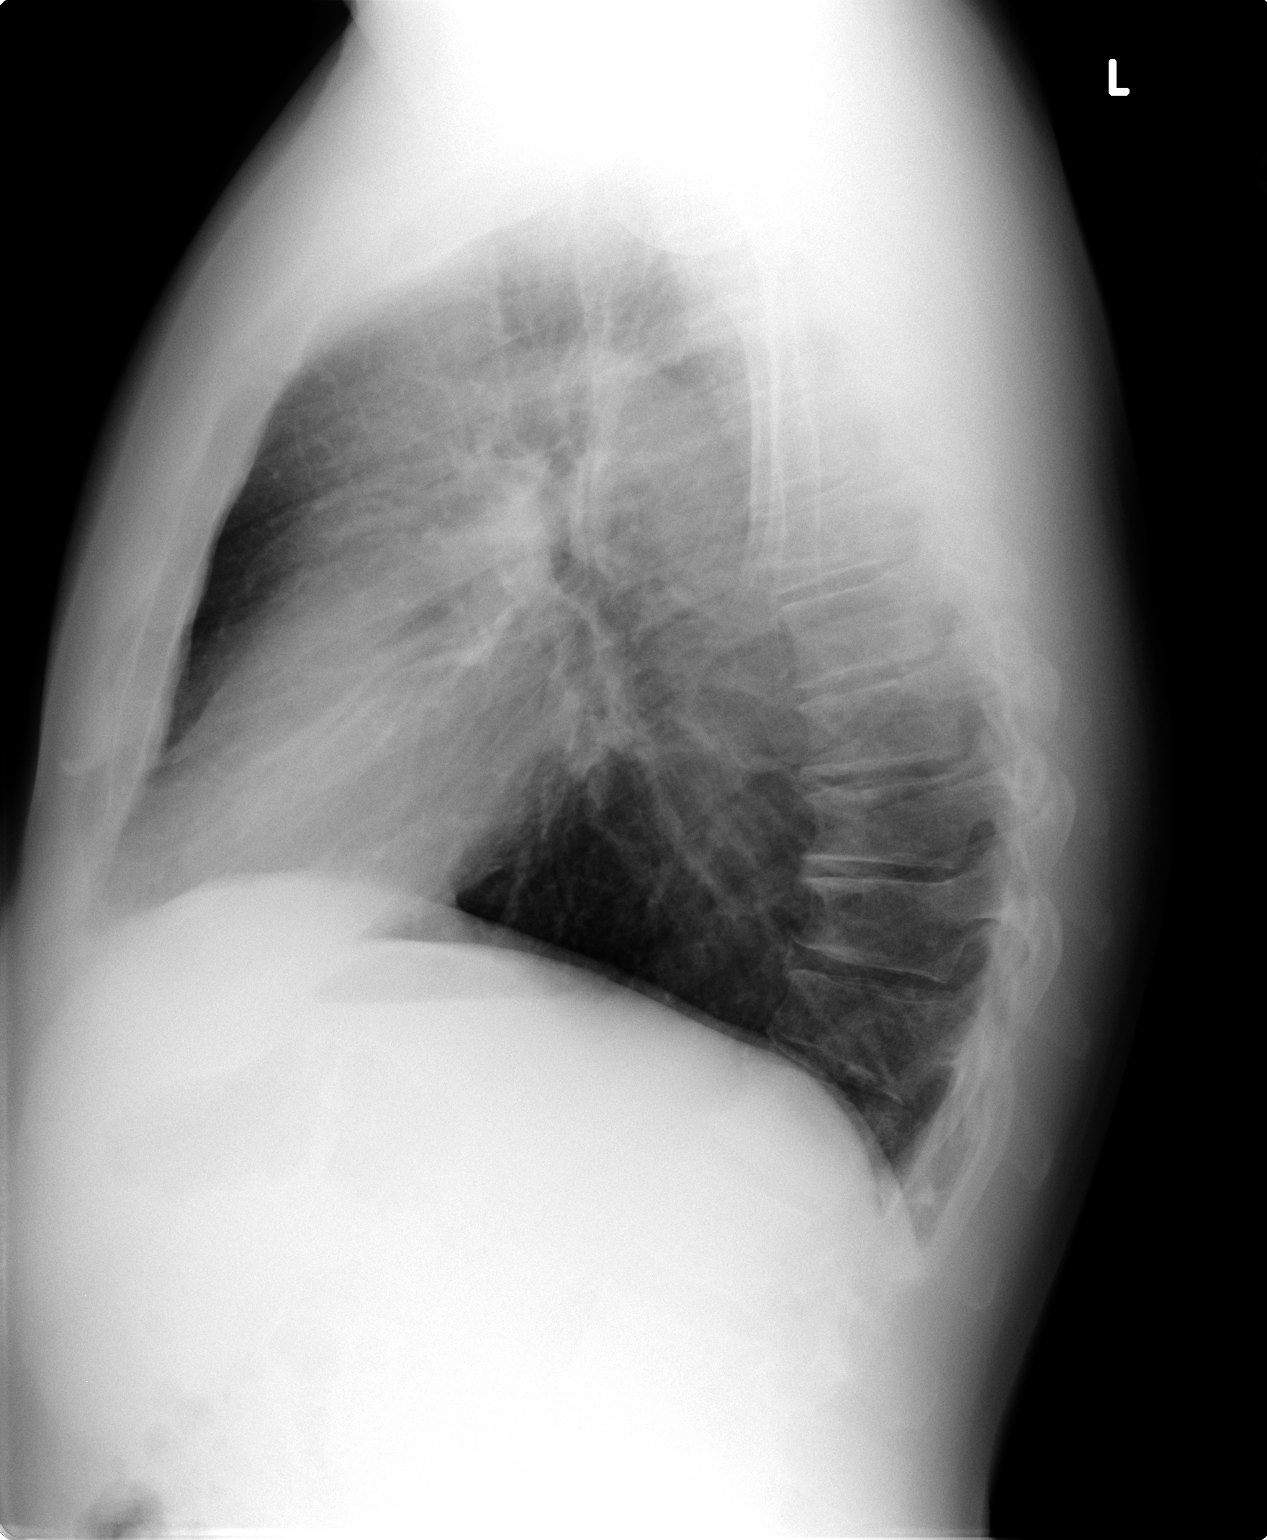

[2 of 2 positions shown; findings below may reference images not displayed]

FINDINGS: Trachea is midline.  Heart size normal.  Lungs are clear.
No pleural fluid.
IMPRESSION: No acute findings.

## 2008-10-28 DIAGNOSIS — J309 Allergic rhinitis, unspecified: Secondary | ICD-10-CM | POA: Insufficient documentation

## 2008-10-29 ENCOUNTER — Telehealth: Payer: Self-pay | Admitting: Pulmonary Disease

## 2008-11-07 ENCOUNTER — Ambulatory Visit: Payer: Self-pay | Admitting: Pulmonary Disease

## 2008-11-07 DIAGNOSIS — R059 Cough, unspecified: Secondary | ICD-10-CM | POA: Insufficient documentation

## 2008-11-07 DIAGNOSIS — R05 Cough: Secondary | ICD-10-CM

## 2008-11-14 ENCOUNTER — Telehealth: Payer: Self-pay | Admitting: Pulmonary Disease

## 2008-12-09 ENCOUNTER — Telehealth (INDEPENDENT_AMBULATORY_CARE_PROVIDER_SITE_OTHER): Payer: Self-pay | Admitting: *Deleted

## 2008-12-13 ENCOUNTER — Ambulatory Visit: Payer: Self-pay | Admitting: Pulmonary Disease

## 2008-12-16 LAB — CONVERTED CEMR LAB
AST: 36 units/L (ref 0–37)
Albumin: 4 g/dL (ref 3.5–5.2)
Alkaline Phosphatase: 64 units/L (ref 39–117)
Bilirubin, Direct: 0.1 mg/dL (ref 0.0–0.3)
Cholesterol: 228 mg/dL — ABNORMAL HIGH (ref 0–200)
Total Protein: 6.7 g/dL (ref 6.0–8.3)
VLDL: 19.8 mg/dL (ref 0.0–40.0)

## 2009-01-24 ENCOUNTER — Telehealth: Payer: Self-pay | Admitting: Pulmonary Disease

## 2009-02-20 ENCOUNTER — Encounter: Payer: Self-pay | Admitting: Pulmonary Disease

## 2009-04-14 ENCOUNTER — Ambulatory Visit: Payer: Self-pay | Admitting: Pulmonary Disease

## 2009-04-22 IMAGING — CR DG LUMBAR SPINE 2-3V
2 series · 2 of 2 positions shown · non-contrast
Comparison: CT examination [DATE].

CLINICAL DATA: History given of spinal stenosis at L4-S1 level.

LUMBAR SPINE - 2-3 VIEW

[t l-spine a.p.]
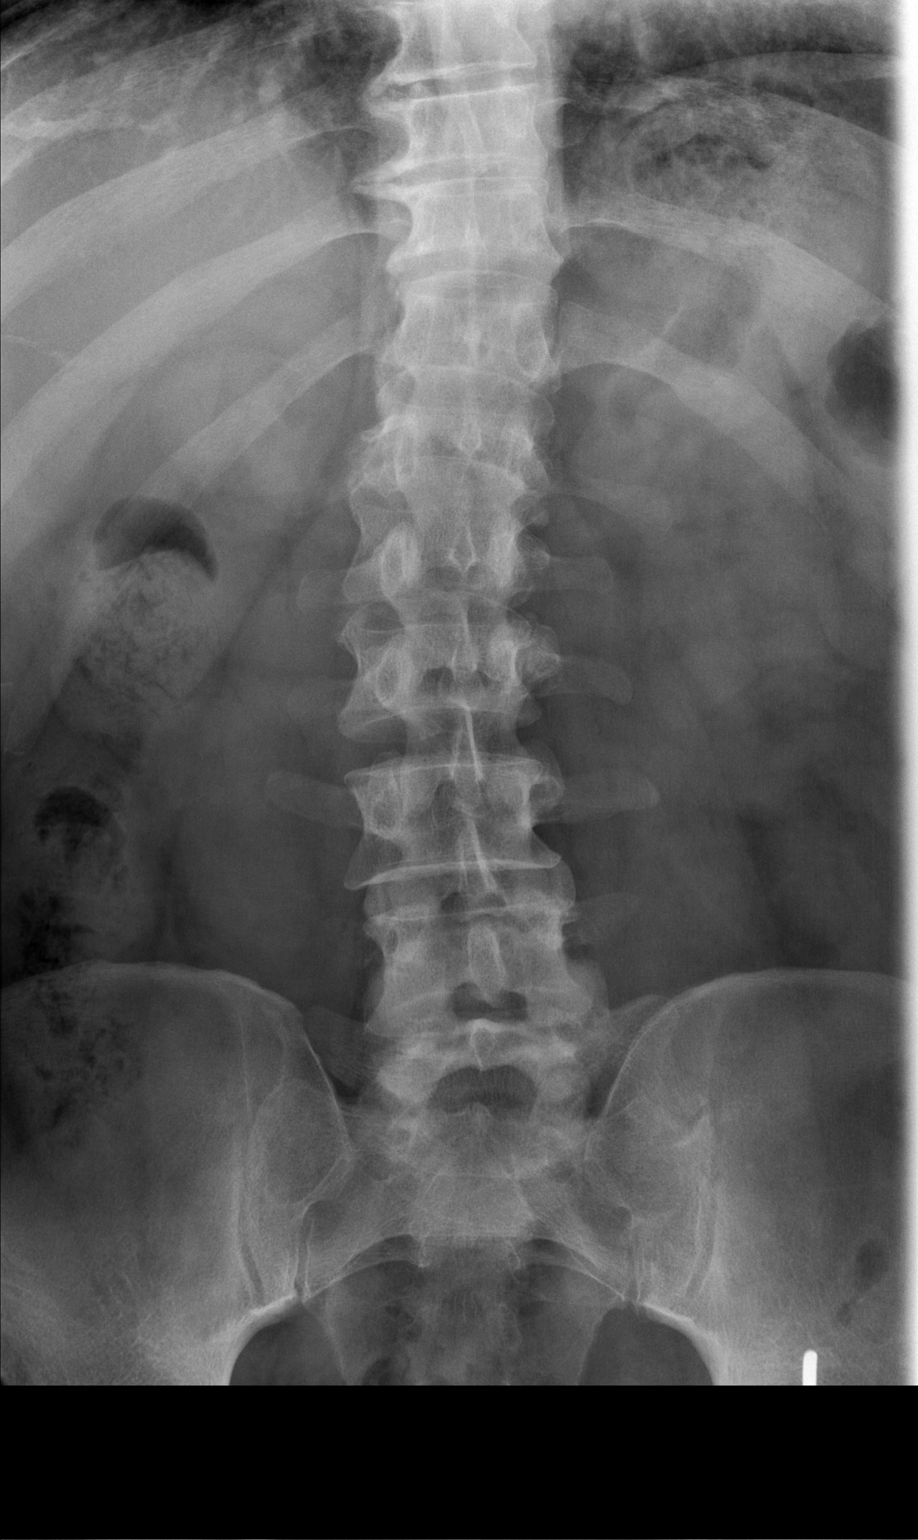

[t l-spine lat]
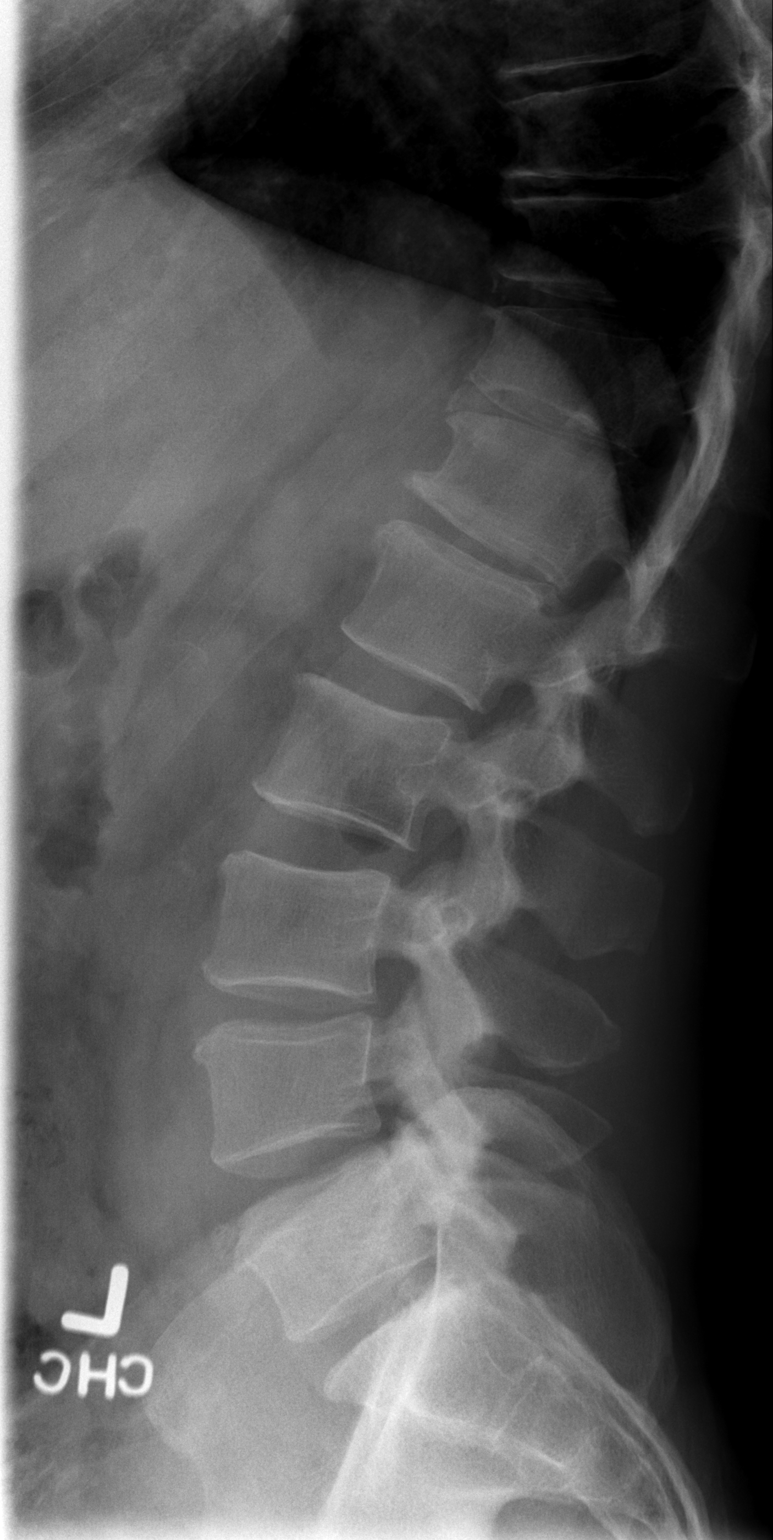

[2 of 2 positions shown; findings below may reference images not displayed]

FINDINGS: There are five non-rib bearing lumbar-type vertebral
bodies.  The twelfth ribs are small.  There is slight scoliosis
convexity to the right.  SI joints appear normal.  Intervertebral
disc spaces are maintained.  There is a marginal osteophyte
formation at multiple levels representing degenerative spondylosis.
There is an image with separation of the anterior superior aspect
of L5 vertebral body.  This is unchanged from previous CT
examination.  No lumbar fracture, subluxation, or bony destruction
is seen.
IMPRESSION: There is evidence of  degenerative spondylosis.  A chronic edge
separation of the anterior superior aspect of L5 vertebral body is
seen unchanged from previous CT examination.  There is slight
scoliosis.

## 2009-04-23 HISTORY — PX: LUMBAR SPINE SURGERY: SHX701

## 2009-04-24 ENCOUNTER — Ambulatory Visit (HOSPITAL_COMMUNITY): Admission: RE | Admit: 2009-04-24 | Discharge: 2009-04-24 | Payer: Self-pay | Admitting: Specialist

## 2009-04-24 IMAGING — CR DG OR PORTABLE SPINE
1 series · 1 of 1 positions shown · non-contrast
Comparison: Intraoperative radiographs from the same day.

CLINICAL DATA: 48-year-old male undergoing lumbar spine surgery.

PORTABLE SPINE

[view not recorded]
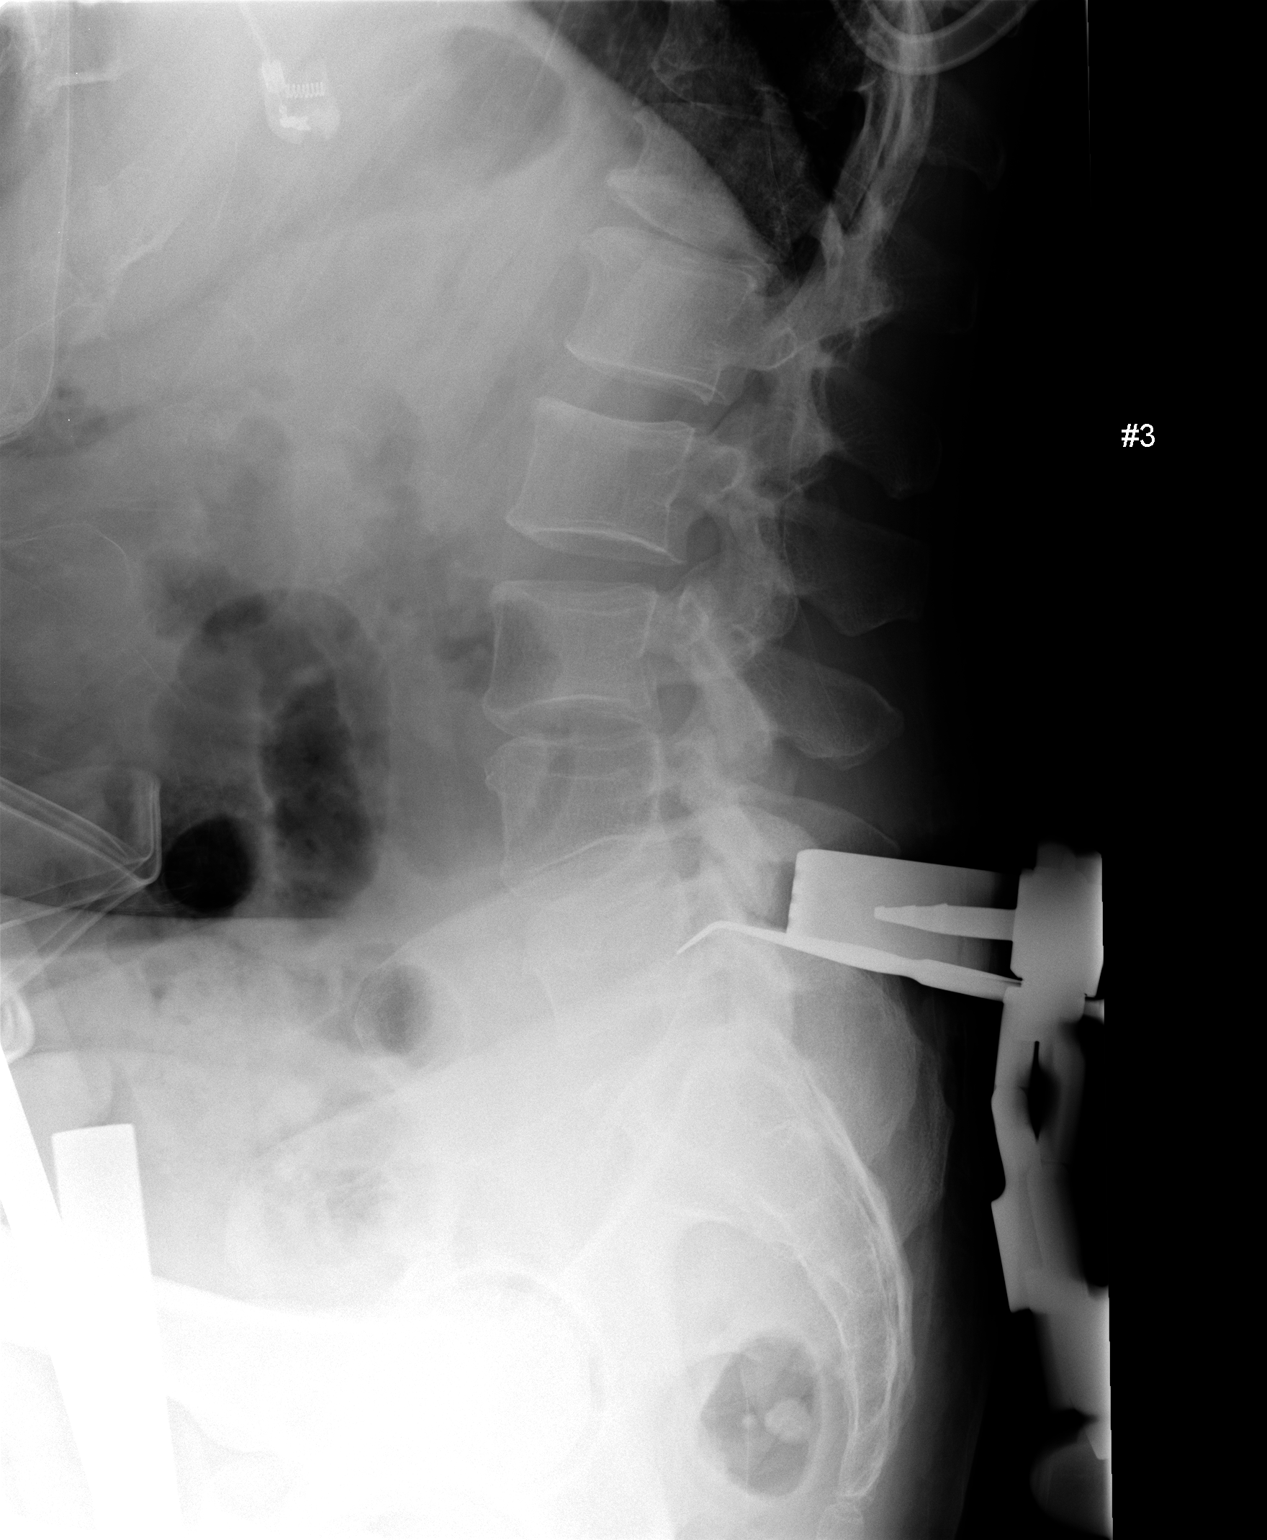

[1 of 1 positions shown; findings below may reference images not displayed]

FINDINGS: Portable cross-table lateral intraoperative view of the
lumbar spine labeled [WI] hours #3.

Surgical probe projects at the L5 pedicle level.  A retractor
projects over the L4 spinous process.
IMPRESSION: Intraoperative localization as above.

## 2009-04-24 IMAGING — CR DG OR PORTABLE SPINE
1 series · 1 of 1 positions shown · non-contrast
Comparison: [DATE]

CLINICAL DATA: Lumbar stenosis

PORTABLE SPINE

[view not recorded]
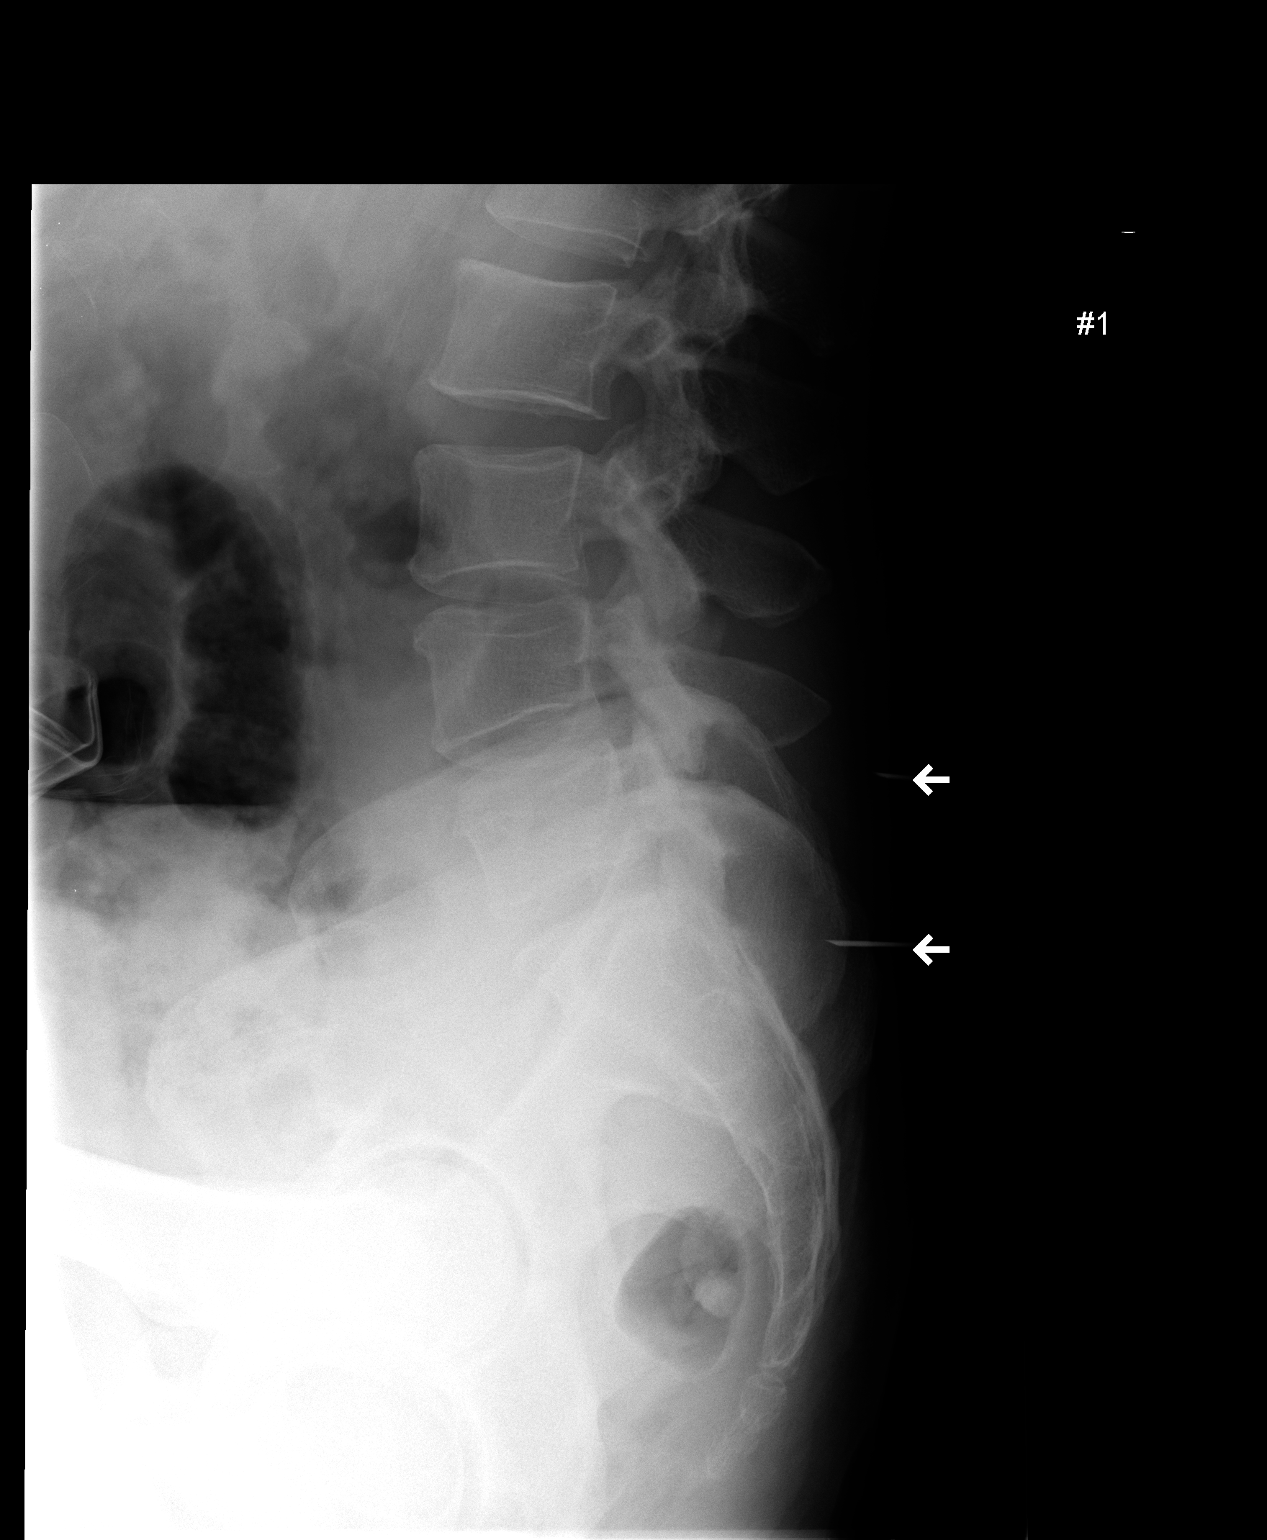

[1 of 1 positions shown; findings below may reference images not displayed]

FINDINGS: A single lateral intraoperative lumbar spine radiograph
shows 2 needles from posterior approach projecting behind the L5
spinous process and first sacral segment.
IMPRESSION: Intraoperative localization

## 2009-04-24 IMAGING — CR DG OR PORTABLE SPINE
1 series · 1 of 1 positions shown · non-contrast
Comparison: Intraoperative radiograph from [7E] hours same day.
Preoperative radiographs [DATE].

CLINICAL DATA: 48-year-old male undergoing lumbar surgery.

PORTABLE SPINE

[view not recorded]
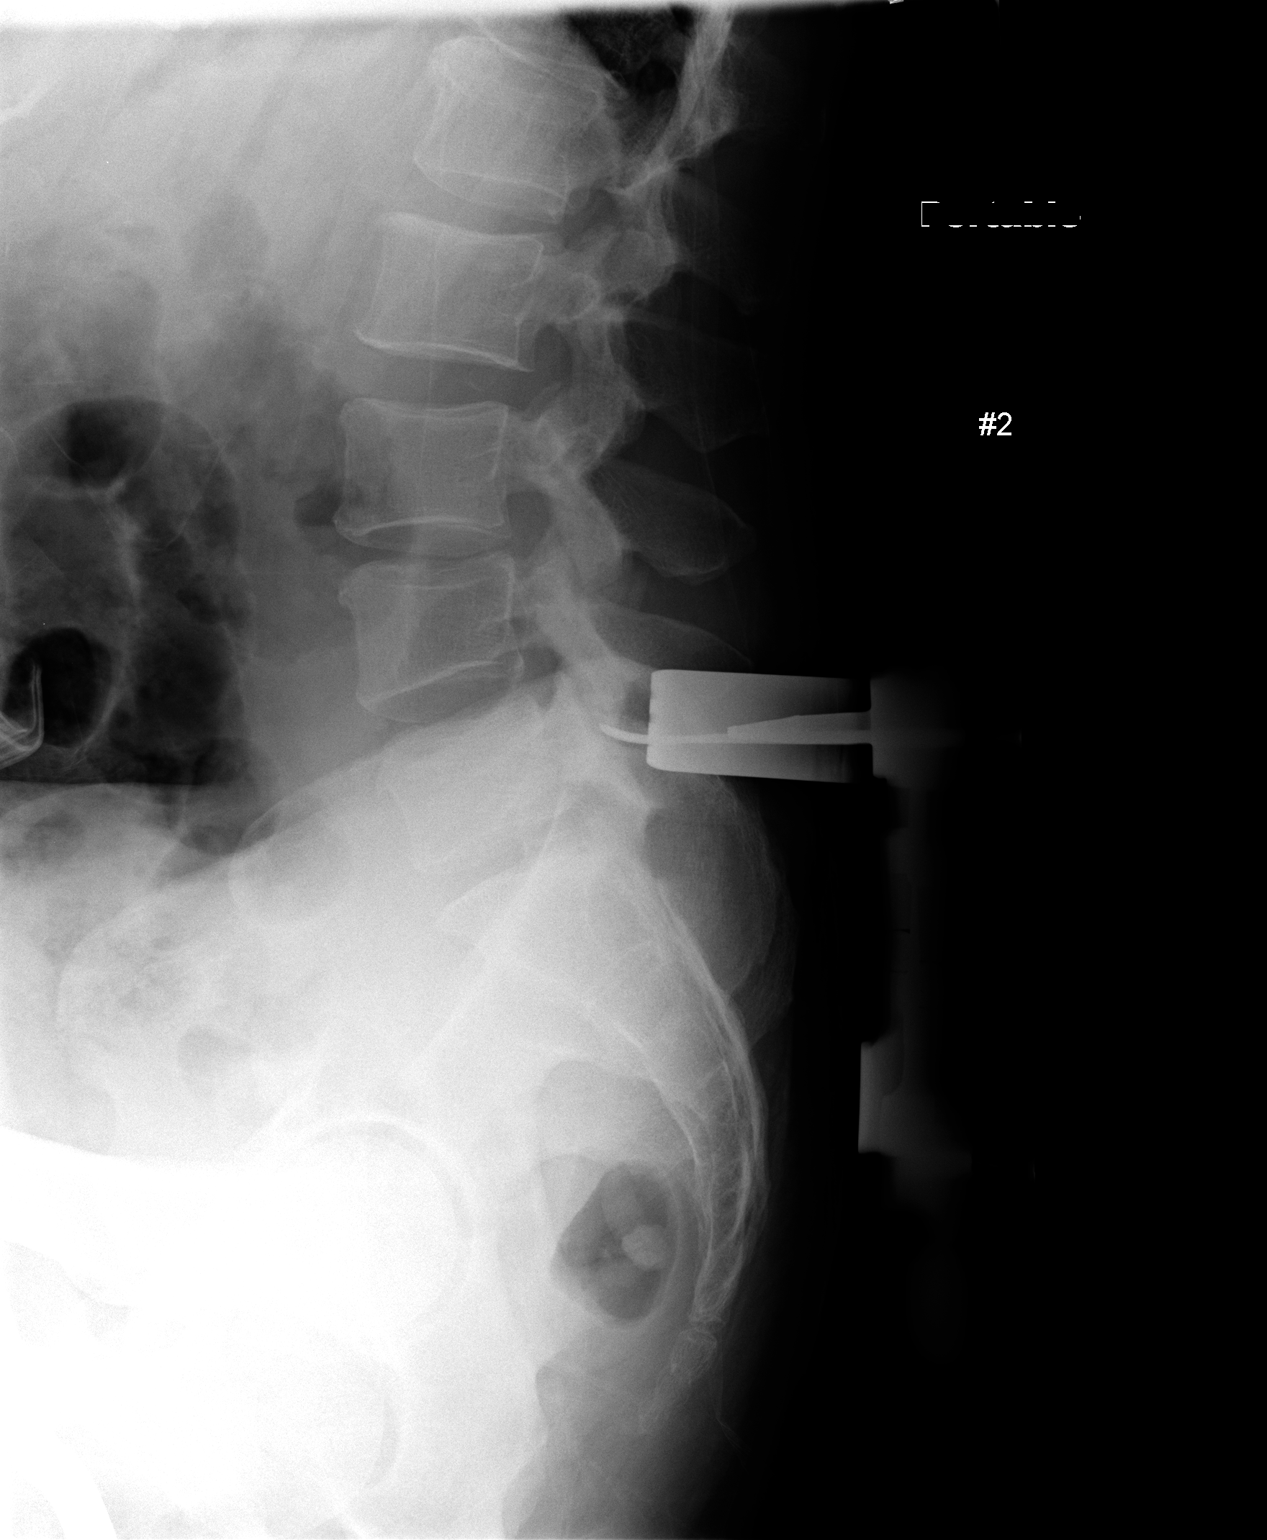

[1 of 1 positions shown; findings below may reference images not displayed]

FINDINGS: Portable cross-table lateral intraoperative view of the
lumbar spine labeled [7E] hours #2.

Surgical probe now projects at the L4-L5 inferior facet joint, at
the level of the L5 pedicle and L4 inferior articulating facet.
IMPRESSION: Intraoperative localization as above.

## 2009-04-24 IMAGING — CR DG OR PORTABLE SPINE
1 series · 1 of 1 positions shown · non-contrast
Comparison: Earlier intraoperative radiograph from the same day.
Preoperative radiographs [DATE].

CLINICAL DATA: 48-year-old male undergoing lumbar surgery.

PORTABLE SPINE

[view not recorded]
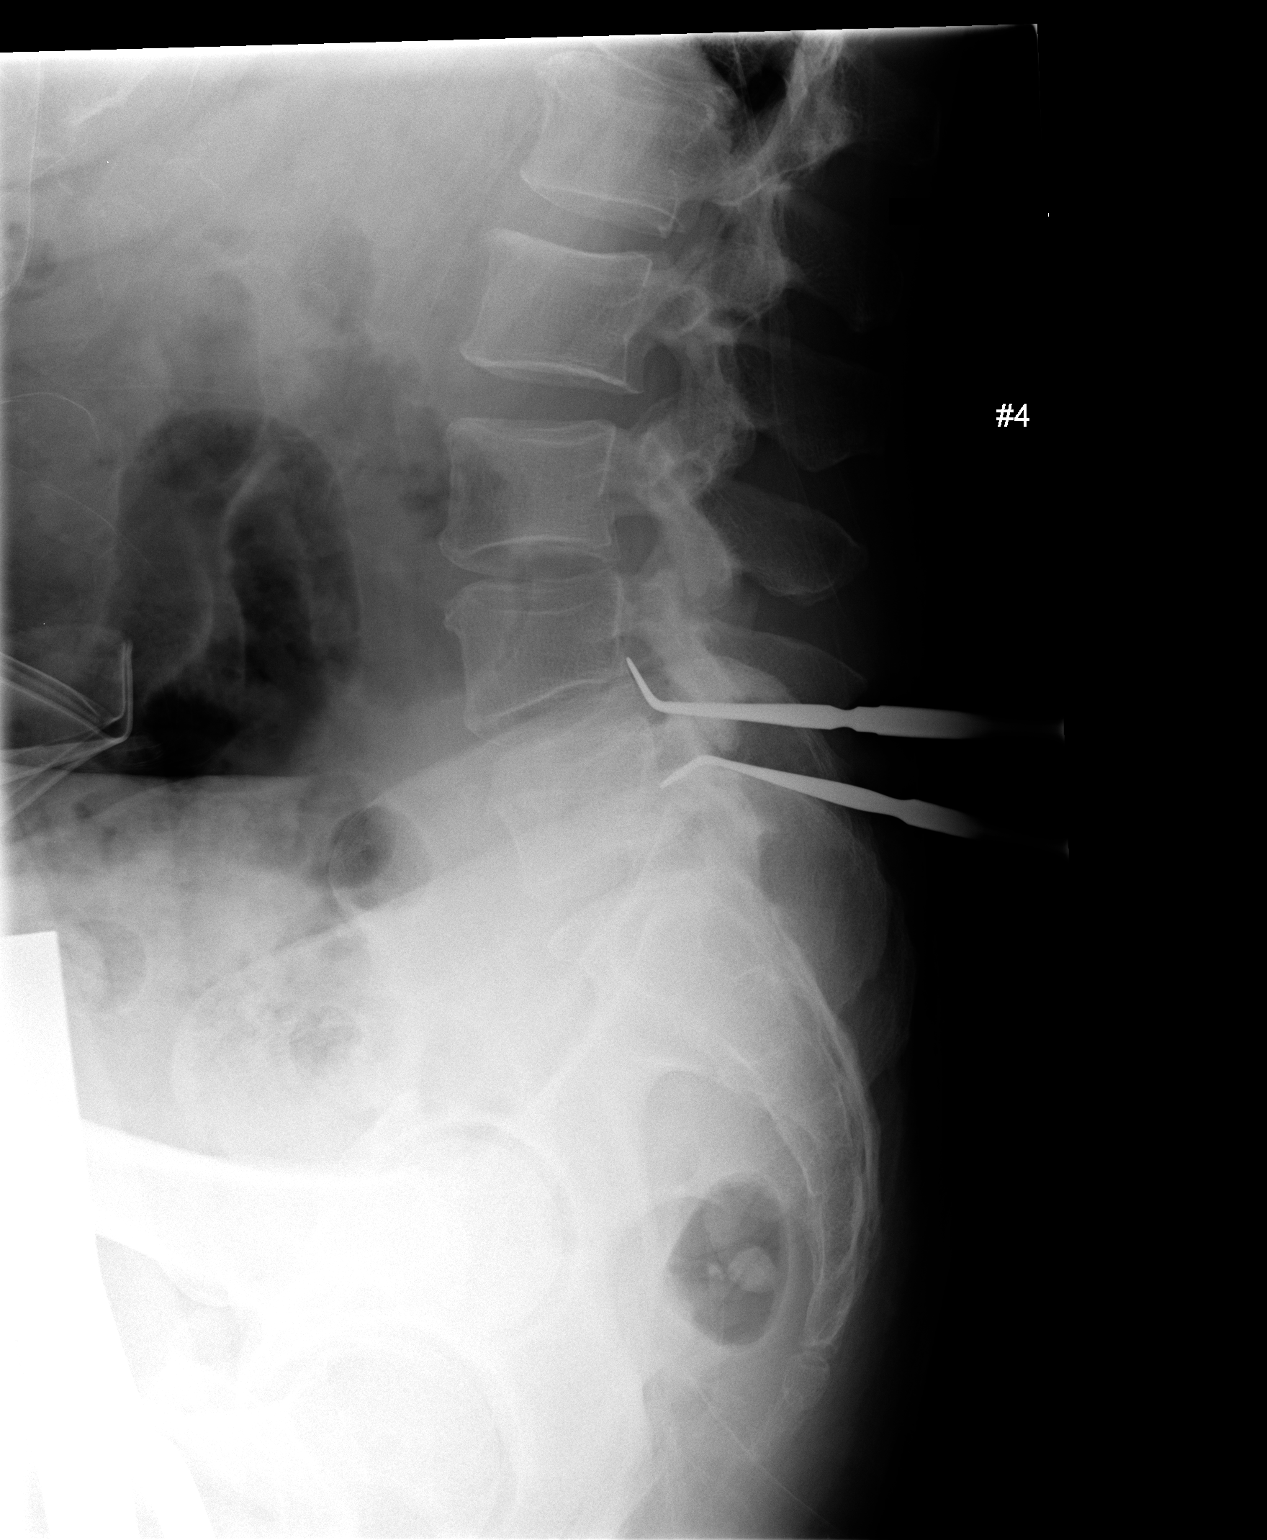

[1 of 1 positions shown; findings below may reference images not displayed]

FINDINGS: Portable cross-table lateral intraoperative view of the
lumbar spine labeled [F9] hours #4.

Two surgical probes are now present.  The more cephalad probe
projects at the anterior L4-L5 (L4 nerve root) neural foramen.  The
more caudal probe appears stable at the L5 pedicle level.
IMPRESSION: Intraoperative localization as above.

## 2009-04-25 ENCOUNTER — Telehealth: Payer: Self-pay | Admitting: Pulmonary Disease

## 2009-04-30 ENCOUNTER — Encounter: Payer: Self-pay | Admitting: Adult Health

## 2009-05-02 ENCOUNTER — Encounter: Payer: Self-pay | Admitting: Adult Health

## 2009-05-06 ENCOUNTER — Encounter: Payer: Self-pay | Admitting: Adult Health

## 2009-05-08 ENCOUNTER — Encounter: Payer: Self-pay | Admitting: Adult Health

## 2009-05-13 ENCOUNTER — Ambulatory Visit: Payer: Self-pay | Admitting: Pulmonary Disease

## 2009-05-13 DIAGNOSIS — R259 Unspecified abnormal involuntary movements: Secondary | ICD-10-CM | POA: Insufficient documentation

## 2009-05-14 LAB — CONVERTED CEMR LAB: Folate: 9.5 ng/mL

## 2009-05-20 ENCOUNTER — Telehealth: Payer: Self-pay | Admitting: Pulmonary Disease

## 2009-05-21 ENCOUNTER — Encounter: Payer: Self-pay | Admitting: Pulmonary Disease

## 2009-05-26 ENCOUNTER — Encounter: Payer: Self-pay | Admitting: Pulmonary Disease

## 2009-05-28 ENCOUNTER — Telehealth (INDEPENDENT_AMBULATORY_CARE_PROVIDER_SITE_OTHER): Payer: Self-pay | Admitting: *Deleted

## 2009-07-01 ENCOUNTER — Encounter: Payer: Self-pay | Admitting: Pulmonary Disease

## 2009-07-30 ENCOUNTER — Ambulatory Visit: Payer: Self-pay | Admitting: Pulmonary Disease

## 2009-07-30 ENCOUNTER — Telehealth (INDEPENDENT_AMBULATORY_CARE_PROVIDER_SITE_OTHER): Payer: Self-pay | Admitting: *Deleted

## 2009-07-31 ENCOUNTER — Ambulatory Visit: Payer: Self-pay | Admitting: Pulmonary Disease

## 2009-08-10 LAB — CONVERTED CEMR LAB
Albumin: 4 g/dL (ref 3.5–5.2)
Basophils Relative: 0.6 % (ref 0.0–3.0)
Bilirubin, Direct: 0.1 mg/dL (ref 0.0–0.3)
CO2: 28 meq/L (ref 19–32)
Calcium: 9.2 mg/dL (ref 8.4–10.5)
Creatinine, Ser: 1 mg/dL (ref 0.4–1.5)
GFR calc non Af Amer: 84.09 mL/min (ref >60)
Glucose, Bld: 106 mg/dL — ABNORMAL HIGH (ref 70–99)
HCT: 43.6 % (ref 39.0–52.0)
HDL: 46.7 mg/dL (ref >39.00)
Hemoglobin: 14.9 g/dL (ref 13.0–17.0)
Lymphocytes Relative: 33.2 % (ref 12.0–46.0)
Monocytes Relative: 12.9 % — ABNORMAL HIGH (ref 3.0–12.0)
Neutro Abs: 2.1 10*3/uL (ref 1.4–7.7)
RBC: 4.6 M/uL (ref 4.22–5.81)
Total CHOL/HDL Ratio: 4
Total Protein: 6.7 g/dL (ref 6.0–8.3)
Triglycerides: 172 mg/dL — ABNORMAL HIGH (ref 0.0–149.0)
VLDL: 34.4 mg/dL (ref 0.0–40.0)

## 2009-08-25 ENCOUNTER — Telehealth: Payer: Self-pay | Admitting: Pulmonary Disease

## 2009-08-25 ENCOUNTER — Encounter: Payer: Self-pay | Admitting: Pulmonary Disease

## 2009-08-28 ENCOUNTER — Ambulatory Visit: Payer: Self-pay | Admitting: Pulmonary Disease

## 2009-10-01 ENCOUNTER — Encounter (INDEPENDENT_AMBULATORY_CARE_PROVIDER_SITE_OTHER): Payer: Self-pay | Admitting: *Deleted

## 2009-11-18 ENCOUNTER — Telehealth (INDEPENDENT_AMBULATORY_CARE_PROVIDER_SITE_OTHER): Payer: Self-pay | Admitting: *Deleted

## 2010-02-20 ENCOUNTER — Telehealth (INDEPENDENT_AMBULATORY_CARE_PROVIDER_SITE_OTHER): Payer: Self-pay | Admitting: *Deleted

## 2010-05-26 ENCOUNTER — Telehealth (INDEPENDENT_AMBULATORY_CARE_PROVIDER_SITE_OTHER): Payer: Self-pay | Admitting: *Deleted

## 2010-06-10 ENCOUNTER — Telehealth: Payer: Self-pay | Admitting: Gastroenterology

## 2010-08-05 ENCOUNTER — Encounter: Payer: Self-pay | Admitting: Pulmonary Disease

## 2010-08-05 ENCOUNTER — Ambulatory Visit: Payer: Self-pay | Admitting: Pulmonary Disease

## 2010-08-05 ENCOUNTER — Telehealth (INDEPENDENT_AMBULATORY_CARE_PROVIDER_SITE_OTHER): Payer: Self-pay | Admitting: *Deleted

## 2010-08-07 ENCOUNTER — Telehealth (INDEPENDENT_AMBULATORY_CARE_PROVIDER_SITE_OTHER): Payer: Self-pay | Admitting: *Deleted

## 2010-08-07 LAB — CONVERTED CEMR LAB
Basophils Relative: 0.4 % (ref 0.0–3.0)
Bilirubin, Direct: 0.1 mg/dL (ref 0.0–0.3)
CO2: 30 meq/L (ref 19–32)
Calcium: 9.4 mg/dL (ref 8.4–10.5)
Cholesterol: 264 mg/dL — ABNORMAL HIGH (ref 0–200)
Creatinine, Ser: 0.9 mg/dL (ref 0.4–1.5)
Direct LDL: 185.7 mg/dL
Eosinophils Absolute: 0 10*3/uL (ref 0.0–0.7)
GFR calc non Af Amer: 91.06 mL/min (ref >60.00)
HDL: 54.5 mg/dL (ref >39.00)
Hemoglobin: 15.4 g/dL (ref 13.0–17.0)
Lymphocytes Relative: 25.1 % (ref 12.0–46.0)
MCHC: 34.1 g/dL (ref 30.0–36.0)
Neutro Abs: 4.3 10*3/uL (ref 1.4–7.7)
RBC: 4.84 M/uL (ref 4.22–5.81)
Total Bilirubin: 0.9 mg/dL (ref 0.3–1.2)
Total CHOL/HDL Ratio: 5
Total Protein: 7.1 g/dL (ref 6.0–8.3)
Triglycerides: 62 mg/dL (ref 0.0–149.0)
VLDL: 12.4 mg/dL (ref 0.0–40.0)

## 2010-08-10 ENCOUNTER — Ambulatory Visit: Payer: Self-pay | Admitting: Pulmonary Disease

## 2010-08-18 ENCOUNTER — Telehealth (INDEPENDENT_AMBULATORY_CARE_PROVIDER_SITE_OTHER): Payer: Self-pay | Admitting: *Deleted

## 2010-08-19 ENCOUNTER — Ambulatory Visit
Admission: RE | Admit: 2010-08-19 | Discharge: 2010-08-19 | Payer: Self-pay | Source: Home / Self Care | Attending: Pulmonary Disease | Admitting: Pulmonary Disease

## 2010-08-25 DIAGNOSIS — B029 Zoster without complications: Secondary | ICD-10-CM | POA: Insufficient documentation

## 2010-09-01 ENCOUNTER — Telehealth (INDEPENDENT_AMBULATORY_CARE_PROVIDER_SITE_OTHER): Payer: Self-pay | Admitting: *Deleted

## 2010-09-22 NOTE — Progress Notes (Signed)
Summary: rx request Vyvanse  Phone Note Call from Patient Call back at Home Phone [phone]   Caller: Patient Call For: NADEL Summary of Call: pt wants to pick up a rx for VYVANSE- 90 days supply.  Initial call taken by: Tivis Ringer, CNA,  February 20, 2010 10:20 AM  Follow-up for Phone Call        last refill #90 x 0 on 11/18/09 last ov CPX 07/30/09 .Kandice Hams CMA  February 20, 2010 11:14 AM   rx for vyvanse is ready to be picked up. Randell Loop CMA  February 20, 2010 4:01 PM      Prescriptions: VYVANSE 60 MG CAPS (LISDEXAMFETAMINE DIMESYLATE) 1 by mouth once daily  #90 x 0   Entered by:   Randell Loop CMA   Authorized by:   Michele Mcalpine MD   Signed by:   Randell Loop CMA on 02/20/2010   Method used:   Print then Give to Patient   RxID:   [id number]

## 2010-09-22 NOTE — Progress Notes (Signed)
Summary: refill on Vyvanse- also scheduled yearly cpx  Phone Note Call from Patient   Caller: Patient--[phone] Call For: nadel Reason for Call: Refill Medication Summary of Call: needs written rx for 90 days--vyvanse-pt will pick up Initial call taken by: Lehman Prom,  May 26, 2010 12:14 PM  Follow-up for Phone Call        pt was last seen by SN for a CPX Dec 2010.  No pending appts.  Will print rx for SN to sign and will schedule pt an appt with SN since he is due.  Aundra Millet Reynolds LPN  May 26, 2010 12:27 PM   rx signed by [facility] and is up at front desk for pt to pick up.  Pt is aware.  Pt also scheduled his yearly physical with SN for 08/05/2010 at 11:30 am. . Arman Filter LPN  May 26, 2010 2:30 PM     Prescriptions: VYVANSE 60 MG CAPS (LISDEXAMFETAMINE DIMESYLATE) 1 by mouth once daily  #90 x 0   Entered by:   Arman Filter LPN   Authorized by:   Michele Mcalpine MD   Signed by:   Arman Filter LPN on 62/69/4854   Method used:   Print then Give to Patient   RxID:   [id number]

## 2010-09-22 NOTE — Progress Notes (Signed)
Summary: Schedule Colonoscopy  Phone Note Outgoing Call Call back at Home Phone [phone]   Call placed by: Harlow Mares CMA Duncan Dull),  June 10, 2010 4:23 PM Call placed to: Patient Summary of Call: spoke to the patient and he just opened a bakery and he will have to look at  his schedule and call back to schedule his colonoscopy I gave him the number Initial call taken by: Harlow Mares CMA Duncan Dull),  June 10, 2010 4:34 PM

## 2010-09-22 NOTE — Assessment & Plan Note (Signed)
Summary: cough chest pains/ mbw   Primary Provider/Referring Provider:  Kriste Basque  CC:  Acute visit.  Pt c/o dry cough and sore throat x 2 days.  He also c/o chest tightness and increased SOB.  SOB gets worse when lies down at night. Denies any other complaints today.Marland Kitchen  History of Present Illness: 50 year old white male patient of Dr. Kriste Basque with known history of Hyperlipidemia, GERD.    May 09, 2008--Presents for  2 days of cough, congestion, sinus pain,  pressure with green nasal discharge. OTC no help.   07/2009-- follow up . Pt is on  Concerta 54mg /d for his ADHD prev written by DrCottle... he notes that he is quite stable w/ this med- it works well without side effects... he takes it [place] thru Fri usually and off for the weekends, and occas drug holidays... 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 overall doing well...  September 12, 2008--Complains of 2 days of productive cough, congestion, thick mucus, wheezing and nasal drip/sinus pressure. OTC not helping.  --Rx Omnicef  October 10, 2008--Returns w/ persistent cough, minimally production, malaise, sob with exertion , tight, finished antibiotic 2 weeks ago but still c/o same symptons.    October 25, 2008 --Returns still complaining of cough. Last ov took Augmentin/prednisone. did not have any improviemnt except cough now mainly clear mucous to dry cough. Cough is aggravating will not go away. Has nasal drip and drainage. Having trouble sleeping over last few months. Wants a sleeping pill to help get back on track.   April 14, 2009--PT here to discuss increasing his  dosage on Vyvance.   He also needs a 90 day supply on several of his medications. Has trrouble sleeping, needs refill on lunesta-this really helps.  switched from concerta to vyvanse d/t cost. started at low dose now want higher dose. Does well but wears off.   Sept 21, 2010---Pt came in to the office today with worsening bilateral hands shaking, R.hand more than  L.  Pt has had the probem roughly 12 years ago, has seen orthorpedic 10 years ago for the carpal tunnel wore braces - symptoms of hand/finger numbness resolved. Has noticed w/ detailed cake work his hands tremble. He is a chef/ works on detailed cakes. He on Vyvanse , recent increased dose. He denies  coffee use or decongestants. Rare soda. Does drink green tea.  Believes grandmother had tremor. No extremity weakness, gait problems or speech problems.     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 he is opening "[employer]" in [place]...   August 28, 2009 --Presents for an acute office visit.  Pt c/o dry cough and sore throat x 2 days.  He also c/o chest tightness and increased SOB.  SOB gets worse when lies down at night. Denies any other complaints today. OTC not helping. Denies chest pain, dyspnea, orthopnea, hemoptysis, fever, n/v/d, edema, headache.      Preventive Screening-Counseling & Management  Alcohol-Tobacco     Smoking Status: never  Current Medications (verified): 1)  Zyrtec Allergy 10 Mg Tabs (Cetirizine Hcl) .... Take 1 Tab By Mouth Once Daily in The Am... 2)  Nasacort Aq 55 Mcg/act Aers (Triamcinolone Acetonide(Nasal)) .... 2 Sprays in Each Nostril At Bedtime.Marland Kitchen 3)  Singulair 10 Mg Tabs (Montelukast Sodium) .... Take 1 Tab By Mouth Once Daily.Marland KitchenMarland Kitchen 4)  Pravastatin Sodium 80 Mg Tabs (Pravastatin Sodium) .Marland KitchenMarland KitchenMarland Kitchen  Take 1 Tab By Mouth At Bedtime.Marland KitchenMarland Kitchen 5)  Nexium 40 Mg Cpdr (Esomeprazole Magnesium) .... Take 1 Tablet By Mouth Once A Day 6)  Advil 200 Mg  Tabs (Ibuprofen) .... As Needed 7)  Multivitamins   Tabs (Multiple Vitamin) .... Take 1 Tablet By Mouth Once A Day 8)  Vyvanse 60 Mg Caps (Lisdexamfetamine Dimesylate) .Marland Kitchen.. 1 By Mouth Once Daily 9)  Androgel Pump 1 % Gel (Testosterone) .... Apply Once Daily As Directed By Dr. Darvin Neighbours 10)  Lunesta 3 Mg Tabs (Eszopiclone) .... Take 1 Tab By Mouth At  Bedtime As Needed 11)  Robitussin Maximum Strength 15 Mg/47ml Syrp (Dextromethorphan Hbr) .... Per Bottle Directions As Needed  Allergies (verified): 1)  ! 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) DEGENERATIVE JOINT DISEASE (ICD-715.90) BACK PAIN, LUMBAR (ICD-724.2) TREMOR (ICD-781.0) ATTENTION DEFICIT HYPERACTIVITY DISORDER, ADULT (ICD-314.01) ANXIETY (ICD-300.00)  Past Surgical History: Last updated: 07/30/2009 S/P varicocele repair & vasectomy by ZOXWRUEA S/P right shoulder surg by drGramig S/P lumbar spine surg 9/10 by DrBeane  Review of Systems      See HPI  Vital Signs:  Patient profile:   50 year old male Weight:      177 pounds O2 Sat:      99 % on Room air Temp:     98.5 degrees F oral Pulse rate:   85 / minute BP sitting:   118 / 82  (left arm)  Vitals Entered By: Vernie Murders (August 28, 2009 9:50 AM)  O2 Flow:  Room air CC: Acute visit.  Pt c/o dry cough and sore throat x 2 days.  He also c/o chest tightness and increased SOB.  SOB gets worse when lies down at night. Denies any other complaints today. Is Patient Diabetic? No   Physical Exam  Additional Exam:  WD, WN, 50 y/o WM in NAD... GENERAL:  Alert & oriented; pleasant & cooperative... HEENT:  [place]/AT, EOM-wnl, PERRLA, Fundi-benign, EACs-clear, TMs-wnl, NOSE-clear dishcarge , THROAT-clear, no exuadate NECK:  Supple w/ fairROM; no JVD; normal carotid impulses w/o bruits; no thyromegaly or nodules palpated; no lymphadenopathy. CHEST:  Clear to P & A; without wheezes/ rales/ or rhonchi. HEART:  Regular Rhythm; without murmurs/ rubs/ or gallops. ABDOMEN:  Soft & nontender; normal bowel sounds; no organomegaly or masses detected. EXT: without  deformities or arthritic changes; no varicose veins/ venous insuffic/ or edema.     Impression & Recommendations:  Problem # 1:  Hx of ASTHMATIC BRONCHITIS, ACUTE (ICD-466.0)  xopenex neb today in office.  REC:  Mucinex DM two times a day as needed cough /congestion Zyrtec once daily for drainage Saline nasal as needed  Zpack to have on hold if symptoms worse with discolored mucus.  Increase fluids and rest.  Hydromet as needed cough, may make you sleepy.  Please contact office for sooner follow up if symptoms do not improve or worsen  His updated medication list for this problem includes:    Singulair 10 Mg Tabs (Montelukast sodium) .Marland Kitchen... Take 1 tab by mouth once daily...    Robitussin Maximum Strength 15 Mg/50ml Syrp (Dextromethorphan hbr) .Marland Kitchen... Per bottle directions as needed    Hydromet 5-1.5 Mg/97ml Syrp (Hydrocodone-homatropine) .Marland Kitchen... 1-2 tsp every 4-6 hrs as needed cough  Orders: Est. Patient Level III (54098)  Medications Added to Medication List This Visit: 1)  Robitussin Maximum Strength 15 Mg/8ml Syrp (Dextromethorphan hbr) .... Per bottle directions as needed 2)  Hydromet 5-1.5 Mg/50ml Syrp (Hydrocodone-homatropine) .Marland Kitchen.. 1-2 tsp every 4-6 hrs as needed cough  Complete Medication List: 1)  Zyrtec Allergy 10 Mg Tabs (Cetirizine hcl) .... Take 1 tab by mouth once daily in the am... 2)  Nasacort Aq 55 Mcg/act Aers (Triamcinolone acetonide(nasal)) .... 2 sprays in each nostril at bedtime.Marland Kitchen 3)  Singulair 10 Mg Tabs (Montelukast sodium) .... Take 1 tab by mouth once daily.Marland KitchenMarland Kitchen 4)  Pravastatin Sodium 80 Mg Tabs (Pravastatin sodium) .... Take 1 tab by mouth at bedtime.Marland KitchenMarland Kitchen 5)  Nexium 40 Mg Cpdr (Esomeprazole magnesium) .... Take 1 tablet by mouth once a day 6)  Advil 200 Mg Tabs (Ibuprofen) .... As needed 7)  Multivitamins Tabs (Multiple vitamin) .... Take 1 tablet by mouth once a day 8)  Vyvanse 60 Mg Caps (Lisdexamfetamine dimesylate) .Marland Kitchen.. 1 by mouth once daily 9)  Androgel Pump 1  % Gel (Testosterone) .... Apply once daily as directed by dr. Ferne Reus davis 10)  Lunesta 3 Mg Tabs (Eszopiclone) .... Take 1 tab by mouth at bedtime as needed 11)  Robitussin Maximum Strength 15 Mg/42ml Syrp (Dextromethorphan hbr) .... Per bottle directions as needed 12)  Hydromet 5-1.5 Mg/53ml Syrp (Hydrocodone-homatropine) .Marland Kitchen.. 1-2 tsp every 4-6 hrs as needed cough  Patient Instructions: 1)  Mucinex DM two times a day as needed cough /congestion 2)  Zyrtec once daily for drainage 3)  Saline nasal as needed  4)  Zpack to have on hold if symptoms worse with discolored mucus.  5)  Increase fluids and rest.  6)  Hydromet as needed cough, may make you sleepy.  7)  Please contact office for sooner follow up if symptoms do not improve or worsen  Prescriptions: HYDROMET 5-1.5 MG/5ML SYRP (HYDROCODONE-HOMATROPINE) 1-2 tsp every 4-6 hrs as needed cough  #8 oz x 0   Entered and Authorized by:   Rubye Oaks NP   Signed by:   Mikalah Skyles NP on 08/28/2009   Method used:   Print then Give to Patient   RxID:   [id number]   Appended Document: Xopenex tx    Clinical Lists Changes  Orders: Added new Service order of Nebulizer Tx ([id number]) - Signed       Medication Administration  Medication # 1:    Medication: Xopenex 1.25mg     Diagnosis: COUGH (ICD-786.2)    Dose: 1.25mg     Route: inhaled    Exp Date: 06-11    Lot #: [id number]    Mfr: SEPRACOR    Patient tolerated medication without complications    Given by: Elray Buba RN (August 28, 2009 11:02 AM)  Orders Added: 1)  Nebulizer Tx [phone]

## 2010-09-22 NOTE — Progress Notes (Signed)
Summary: rx  Phone Note Call from Patient Call back at Home Phone [phone]   Caller: Patient Call For: nadel Reason for Call: Talk to Nurse Summary of Call: pt looks like he needs new 90 day rx for Vyvanse 60mg  1 per day.  Will pick up if you'll  Initial call taken by: Eugene Gavia,  November 18, 2009 1:49 PM  Follow-up for Phone Call        pt request rx for vyvanse for 90days to be mailed to him. rx printed for SN to sign. Carron Curie CMA  November 18, 2009 2:41 PM    rx has been signed and placed in the mail for pt per pts request. Randell Loop CMA  November 18, 2009 3:23 PM n    Prescriptions: VYVANSE 60 MG CAPS (LISDEXAMFETAMINE DIMESYLATE) 1 by mouth once daily  #90 x 0   Entered by:   Carron Curie CMA   Authorized by:   Michele Mcalpine MD   Signed by:   Carron Curie CMA on 11/18/2009   Method used:   Print then Mail to Patient   RxID:   [id number]

## 2010-09-22 NOTE — Letter (Signed)
Summary: Colonoscopy Letter  [facility]  [address] [place], [place] [address]   Phone: [phone]  Fax: [phone]      October 01, 2009 MRN: [id number]   MAZI BRAILSFORD [address] [place], [place]  [address]   Dear Mr. Capell,   According to your medical record, it is time for you to schedule a Colonoscopy. The American Cancer Society recommends this procedure as a method to detect early colon cancer. Patients with a family history of colon cancer, or a personal history of colon polyps or inflammatory bowel disease are at increased risk.  This letter has beeen generated based on the recommendations made at the time of your procedure. If you feel that in your particular situation this may no longer apply, please contact our office.  Please call our office at [phone] to schedule this appointment or to update your records at your earliest convenience.  Thank you for cooperating with [place] to provide you with the very best care possible.   Sincerely,  Judie Petit T. Russella Dar, M.D.  [facility] [phone]

## 2010-09-22 NOTE — Progress Notes (Signed)
Summary: prescript  Phone Note Call from Patient   Caller: Patient Call For: Reggie Bise Summary of Call: pt misplace prescript for vyvanse 90 day supply will pick up Initial call taken by: Rickard Patience,  August 25, 2009 11:29 AM  Follow-up for Phone Call        please advise if okay to reprint RX. Thaks.Carron Curie CMA  August 25, 2009 12:29 PM   [email or web address] Randell Loop [facility]  August 25, 2009 4:48 PM    pt called back and updated his pharmacy----he stated that he would rather pick the rx up himself.  will leave the rx out front for him. Randell Loop CMA  August 25, 2009 4:57 PM     Prescriptions: VYVANSE 60 MG CAPS (LISDEXAMFETAMINE DIMESYLATE) 1 by mouth once daily  #90 x 0   Entered by:   Randell Loop CMA   Authorized by:   Michele Mcalpine MD   Signed by:   Randell Loop CMA on 08/25/2009   Method used:   Printed then faxed to ...       [facility]* (retail)       [address]       [place], [place]  [address]       Ph: [phone]       Fax: [phone]   RxID:   [id number]

## 2010-09-24 NOTE — Progress Notes (Signed)
Summary: pt requesting meds for sleep  Phone Note Outgoing Call   Summary of Call: called and spoke with pt about his lab results per SN---he is requesting an rx for something to help him sleep---says he is not sleeping at night and he thinks some of this is stress related but would like to have something to help---he has tried [place] in the past and stated that this only worked for him for about 2 wks.  please advise Randell Loop [facility]  August 07, 2010 10:07 AM   Follow-up for Phone Call        per SN----try lorazepam 1 mg   1 by mouth at bedtime as needed for sleep.  #50.  this has been printed out for SN to sign since pt wanted this mailed to his home. Randell Loop [facility]  August 07, 2010 12:32 PM   Rx was mailed to the pt. Vernie Murders  August 07, 2010 3:42 PM     New/Updated Medications: CRESTOR 20 MG TABS (ROSUVASTATIN CALCIUM) take 1/2 tab by mouth at bedtime LORAZEPAM 1 MG TABS (LORAZEPAM) take one tablet by mouth at bedtime as needed for sleep Prescriptions: CRESTOR 20 MG TABS (ROSUVASTATIN CALCIUM) take 1/2 tab by mouth at bedtime  #90 x 3   Entered by:   Randell Loop CMA   Authorized by:   Michele Mcalpine MD   Signed by:   Randell Loop CMA on 08/07/2010   Method used:   Faxed to ...       [facility] (mail-order)       [address] [place], [place]  [address]       Ph: [phone]       Fax: [phone]   RxID:   [id number] LORAZEPAM 1 MG TABS (LORAZEPAM) take one tablet by mouth at bedtime as needed for sleep  #50 x 0   Entered by:   Randell Loop CMA   Authorized by:   Michele Mcalpine MD   Signed by:   Randell Loop CMA on 08/07/2010   Method used:   Print then Give to Patient   RxID:   [id number]

## 2010-09-24 NOTE — Assessment & Plan Note (Signed)
Summary: yearly physical/mg   Primary Care Ceci Taliaferro:  Kriste Basque  CC:  Yearly ROV & CPX....  History of Present Illness: 50 y/o WM here for a follow up vsit...  he has multiple medical problems including hx AB; Hyperlipidemia; GERD; Low-T per Urology; DJD & back pain; and ADD now on Vyvanse...    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 he is opening "[employer]" in [place]...    ~  August 05, 2010:  he's had a good yr- doing well w/ the bakery/ small business... feeling well, exercises regularly w/ MMA at [employer] w/ his kids, P90X, running 5Ks, etc... BP controlled,  no CP/ palpit/ SOB/ etc;  he stopped the Prav40 on his own (?diarrhea) & we discussed recheck FLP & consider low dose Crestor;  he's on Androgel from Urology & we will send for records and refill Rx;  he continues on Vyvanse for ADD & he wishes to continue- "it helps me focus"...    Current Problems:   ALLERGIC RHINITIS (ICD-477.9) - uses OTC antihist as needed + nasal saline for relief...  Hx of ASTHMATIC BRONCHITIS, ACUTE (ICD-466.0) - on SINGULAIR 10mg /d w/ good control & he is very active, etc...   ~  CXR 3/10 was clear, WNL.Marland Kitchen.  ~  CXR 12/11 >> wasn't done, machine down.  HYPERLIPIDEMIA (ICD-272.4) - prev on Prav80 (he stopped on his own due to diarrhea)... now diet alone.  ~  FLP 9/08 showed TChol 238, TG 164, HDL 40, LDL 159... he prefers to continue diet Rx.  ~  FLP 12/09 showed TChol 250, TG 200, HDL 44, LDL 151... rec- start Simvastatin 20mg /d but pt states INTOL w/ itching/ rash and stopped it... changed to Pravastatin40 and tol well so far.  ~  FLP 4/10 on Prav40 showed TChol 228, TG 99, HDL 50, LDL 147... rec> incr to Prav80...  ~  FLP 12/10 on Prav80 showed TChol 192, TG 172, HDL 47, LDL 111... continue same for now.  ~  FLP 12/11 on diet alone showed TChol 264, TG 62, HDL 55, LDL 186... needs Rx, start Cres20-  1/2 daily to start.  GERD (ICD-530.81) - he uses OTC Zantac as needed... no prev EGD...  ~  CT Abd 4/08 showed 1cm right liver cyst, otherw neg and WNL.  ~  Abd Sonar 5/08 showed norm GB/ ducts, etc... study was WNL.  ~  colonoscopy 3/01 by DrStark done for hematochezia was totally WNL...   ~  12/11:  DUE FOR f/u COLON & he will call...  LIVER FUNCTION TESTS, ABNORMAL, HX OF (ICD-V12.2) - transient elevation of LFTs in 2008... resolved to normal w/ conservative approach, no alcohol, etc...  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 hx of varicocele repair, vasectomy, and microhematuria eval in the past...on Rx w/ Androgel 4 pumps daily>   ~  Testos level from DrDavis showed 11/09=224 ([phone]), 12/09=327, 7/10=338  ~  PSA level from DrDavis showed 12/09=1.98  ~  PSA here 12/11 = 1.65  DEGENERATIVE JOINT DISEASE (ICD-715.90) - he takes Advil Prn... right shoulder pain evaluated and treated w/ shots per DrGramig, then required surg- improved...   BACK PAIN, LUMBAR (ICD-724.2) - he has hx lumbar & cervical spondylosis on XRays in the past... s/p lumbar spine surg 9/10 by DrBeane...  ATTENTION DEFICIT HYPERACTIVITY DISORDER, ADULT (ICD-314.01) - on VYVANSE 60mg /d,  he says it helps his ADD & helps him to stay focused... prev on Concerta per DrCottle, & he requested change to Vyvanse 60mg  6/10...  ~  Dec09:  he wants me to take over writing for his Concerta 54mg /d for his ADHD prev written by DrCottle... he notes that he is quite stable w/ this med- it works well without side effects... he takes it [place] thru Fri usually and off for the weekends, and occas drug holidays...   ~  2010:  reports doing well on the Concerta & needs Rx refilled> subseq called asking for change to Vyvanse...  ~  2011:  stable on the Vyvanse 60mg /d for his ADHD & he wants to continue same.  ANXIETY (ICD-300.00) - aware, he is not on specific medication for this & notes that  he is doing quite well actually...   Preventive Screening-Counseling & Management  Alcohol-Tobacco     Smoking Status: never  Allergies: 1)  ! Simvastatin (Simvastatin) 2)  ! * Pravastatin  Comments:  Nurse/Medical Assistant: The patient's medications and allergies were reviewed with the patient and were updated in the Medication and Allergy Lists.  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  Social History: Reviewed history from 07/30/2009 and no changes required. 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    Vital Signs:  Patient profile:   50 year old male Height:      66 inches Weight:      163.13 pounds BMI:     26.43 O2 Sat:      100 % on Room air Temp:     99.5 degrees F oral Pulse rate:   84 / minute BP sitting:   112 / 76  (right arm) Cuff size:    regular  Vitals Entered By: Randell Loop CMA (August 05, 2010 11:37 AM)  O2 Sat at Rest %:  100 O2 Flow:  Room air CC: Yearly ROV & CPX... Is Patient Diabetic? No Pain Assessment Patient in pain? no      Comments meds updated today with pt   Physical Exam  Additional Exam:  WD, WN, 50 y/o WM in NAD... GENERAL:  Alert & oriented; pleasant & cooperative... HEENT:  [place]/AT, EOM-wnl, PERRLA, Fundi-benign, EACs-clear, TMs-wnl, NOSE-clear, THROAT-clear & wnl. NECK:  Supple w/ fairROM; no JVD; normal carotid impulses w/o bruits; no thyromegaly or nodules palpated; no lymphadenopathy. CHEST:  Clear to P & A; without wheezes/ rales/ or rhonchi. HEART:  Regular Rhythm; without murmurs/ rubs/ or gallops. ABDOMEN:  Soft & nontender; normal bowel sounds; no organomegaly or masses detected. EXT: without deformities or arthritic changes; no varicose veins/ venous insuffic/ or  edema. NEURO:  CN's intact;  no focal neuro deficits..., fine resting tremor right > left , neg tinels, neg phalens  nml gait, equal strength bilaterally.  DERM:  No lesions noted; no rash etc...    MISC. Report  Procedure date:  08/05/2010  Findings:      DATA REVIEWED:  ~  EKG & FASTING labs >> pt notified by phone  ~  CXR wasn't done due to machine down...  ~  Records & labs sent from Urology & reviewed.   Impression & Recommendations:  Problem # 1:  PHYSICAL EXAMINATION (ICD-V70.0)  Orders: EKG w/ Interpretation (93000) T-2 View CXR (71020TC) >> machine down, wasn't done... FASTING labs were done properly & pt notified...  Problem # 2:  HYPERLIPIDEMIA (ICD-272.4) TChol 264, LDL 186 on diet alone>  we discussed starting CRESTOR 20mg - start w/ 1/2 tab daily... f/u FLP 59mo. His updated medication list for this problem includes:    Pravastatin Sodium 80 Mg Tabs (Pravastatin sodium) .Marland Kitchen... Take 1 tab by mouth at bedtime...  Problem # 3:  GERD (ICD-530.81) GI stable, needs colon & he promises to  call... His updated medication list for this problem includes:    Pantoprazole Sodium 40 Mg Tbec (Pantoprazole sodium) .Marland Kitchen... 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  Problem # 5:  ATTENTION DEFICIT HYPERACTIVITY DISORDER, ADULT (ICD-314.01) He wants to continue the Vyvanse same... OK.  Problem # 6:  OTHER MEDICAL PROBLEMS AS NOTED>>>  Complete Medication List: 1)  Zyrtec Allergy 10 Mg Tabs (Cetirizine hcl) .... Take 1 tab by mouth once daily in the am... 2)  Nasacort Aq 55 Mcg/act Aers (Triamcinolone acetonide(nasal)) .... 2 sprays in each nostril at bedtime.Marland Kitchen 3)  Singulair 10 Mg Tabs (Montelukast sodium) .... Take 1 tab by mouth once daily.Marland KitchenMarland Kitchen 4)  Pravastatin Sodium 80 Mg Tabs (Pravastatin sodium) .... Take 1 tab by mouth at bedtime.Marland KitchenMarland Kitchen 5)  Pantoprazole Sodium 40 Mg Tbec (Pantoprazole sodium) .... Take one tablet by mouth once daily 6)  Advil 200 Mg Tabs (Ibuprofen) .... As needed 7)  Multivitamins Tabs (Multiple vitamin) .... Take 1 tablet by mouth once a day 8)  Vyvanse 60 Mg Caps (Lisdexamfetamine dimesylate) .Marland Kitchen.. 1 by mouth once daily 9)  Androgel Pump 1 % Gel (Testosterone) .... Apply once daily as directed by dr. Darvin Neighbours Prescriptions: PANTOPRAZOLE SODIUM 40 MG TBEC (PANTOPRAZOLE SODIUM) take one tablet by mouth once daily  #90 x 3   Entered by:   Randell Loop CMA   Authorized by:   Michele Mcalpine MD   Signed by:   Randell Loop CMA on 08/06/2010   Method used:   Print then Give to Patient   RxID:   [id number] NEXIUM 40 MG CPDR (ESOMEPRAZOLE MAGNESIUM) Take 1 tablet by mouth once a day Brand medically necessary #90 x 3   Entered by:   Randell Loop CMA   Authorized by:   Michele Mcalpine MD   Signed by:   Randell Loop CMA on 08/06/2010   Method used:   Print then Give to Patient   RxID:   [id number] VYVANSE 60 MG CAPS (LISDEXAMFETAMINE DIMESYLATE) 1 by mouth once  daily  #90 x 0   Entered by:   Randell Loop CMA   Authorized by:   Michele Mcalpine MD   Signed by:   Randell Loop CMA on 08/06/2010   Method used:  Print then Give to Patient   RxID:   [id number] PRAVASTATIN SODIUM 80 MG TABS (PRAVASTATIN SODIUM) take 1 tab by mouth at bedtime...  #90 x 4   Entered by:   Randell Loop CMA   Authorized by:   Michele Mcalpine MD   Signed by:   Randell Loop CMA on 08/06/2010   Method used:   Print then Give to Patient   RxID:   [id number] SINGULAIR 10 MG TABS (MONTELUKAST SODIUM) take 1 tab by mouth once daily...  #90 x 3   Entered by:   Randell Loop CMA   Authorized by:   Michele Mcalpine MD   Signed by:   Randell Loop CMA on 08/06/2010   Method used:   Print then Give to Patient   RxID:   [id number] NASACORT AQ 55 MCG/ACT AERS (TRIAMCINOLONE ACETONIDE(NASAL)) 2 sprays in each nostril at bedtime..  #3 x 3   Entered by:   Randell Loop CMA   Authorized by:   Michele Mcalpine MD   Signed by:   Randell Loop CMA on 08/06/2010   Method used:   Print then Give to Patient   RxID:   [id number]

## 2010-09-24 NOTE — Assessment & Plan Note (Signed)
Summary: ? shingles/ok per jess j/mhh   Primary Provider/Referring Provider:  Kriste Basque  CC:  Pt has a rash on left side x 3 days. and .  History of Present Illness: 50 year old white male patient of Dr. Kriste Basque with known history of Hyperlipidemia, GERD.   08/19/10--Presents for an acute office visit. Complains of a  rash on left side x 3 days. Noticed that he had a burning sensation along left side for last week. 3 days ago, a small rash broke out that is sensitive for shirt to touch. Had chicken pox as a child. Denies chest pain, dyspnea, orthopnea, hemoptysis, fever, n/v/d, edema, headache,recent travel , new meds, or drainage. Discomfort is managemeable , no severe pain.     Current Medications (verified): 1)  Zyrtec Allergy 10 Mg Tabs (Cetirizine Hcl) .... Take 1 Tab By Mouth Once Daily in The Am... 2)  Nasacort Aq 55 Mcg/act Aers (Triamcinolone Acetonide(Nasal)) .... 2 Sprays in Each Nostril At Bedtime.Marland Kitchen 3)  Singulair 10 Mg Tabs (Montelukast Sodium) .... Take 1 Tab By Mouth Once Daily.Marland KitchenMarland Kitchen 4)  Crestor 20 Mg Tabs (Rosuvastatin Calcium) .... Take 1/2 Tab By Mouth At Bedtime 5)  Pantoprazole Sodium 40 Mg Tbec (Pantoprazole Sodium) .... Take One Tablet By Mouth Once Daily 6)  Advil 200 Mg  Tabs (Ibuprofen) .... As Needed 7)  Multivitamins   Tabs (Multiple Vitamin) .... Take 1 Tablet By Mouth Once A Day 8)  Vyvanse 60 Mg Caps (Lisdexamfetamine Dimesylate) .Marland Kitchen.. 1 By Mouth Once Daily 9)  Androgel Pump 1 % Gel (Testosterone) .... Apply Once Daily As Directed By Dr. Darvin Neighbours 10)  Lorazepam 1 Mg Tabs (Lorazepam) .... Take One Tablet By Mouth At Bedtime As Needed For Sleep  Allergies (verified): 1)  ! Simvastatin (Simvastatin) 2)  ! * Pravastatin  Past History:  Past Medical History: Last updated: 08/05/2010 ALLERGIC RHINITIS (ICD-477.9) Hx of ASTHMATIC BRONCHITIS, ACUTE (ICD-466.0) HYPERLIPIDEMIA (ICD-272.4) GERD (ICD-530.81) LIVER FUNCTION TESTS, ABNORMAL, HX OF (ICD-V12.2) TESTOSTERONE  DEFICIENCY (ICD-257.2) DEGENERATIVE JOINT DISEASE (ICD-715.90) BACK PAIN, LUMBAR (ICD-724.2) TREMOR (ICD-781.0) ATTENTION DEFICIT HYPERACTIVITY DISORDER, ADULT (ICD-314.01) ANXIETY (ICD-300.00)  Past Surgical History: Last updated: 08/05/2010 S/P varicocele repair & vasectomy by ZOXWRUEA S/P right shoulder surg by drGramig S/P lumbar spine surg 9/10 by DrBeane  Social History: Last updated: 08/05/2010 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    Physical Exam  Additional Exam:  WD, WN, 50 y/o WM in NAD... GENERAL:  Alert & oriented; pleasant & cooperative... HEENT:  [place]/AT, EOM-wnl, PERRLA, Fundi-benign, EACs-clear, TMs-wnl, NOSE-clear dishcarge , THROAT-clear, no exuadate NECK:  Supple w/ fairROM; no JVD; normal carotid impulses w/o bruits; no thyromegaly or nodules palpated; no lymphadenopathy. CHEST:  Clear to P & A; without wheezes/ rales/ or rhonchi. HEART:  Regular Rhythm; without murmurs/ rubs/ or gallops. ABDOMEN:  Soft & nontender; normal bowel sounds; no organomegaly or masses detected. EXT: without deformities or arthritic changes; no varicose veins/ venous insuffic/ or edema. DERM: along left mid lateral ribs is a small patch of vesicles/papules, mild redness, no streaking or drainage noted. skin is hypersensitive along  the area.    Impression & Recommendations:  Problem #  1:  HERPES ZOSTER (ICD-053.9)  Begin Valtrex three times a day for 7 days  Prednisone taper over next week.  Vicodin 1 by mouth 4-6 hrs as needed pain  follow up Dr. Kriste Basque in 3 months with fasting labs.  follow up 2 weeks Parrett NP .  Please contact office for sooner follow up if symptoms do not improve or worsen   Orders: Est. Patient Level IV (30865)  Problem # 2:  HYPERLIPIDEMIA (ICD-272.4)  reviewed lab work from last ov.  His updated medication list for this problem includes:    Crestor 20 Mg Tabs (Rosuvastatin calcium) .Marland Kitchen... Take 1/2 tab by mouth at bedtime  Labs Reviewed: SGOT: 28 (08/05/2010)   SGPT: 37 (08/05/2010)   HDL:54.50 (08/05/2010), 46.70 (07/31/2009)  LDL:111 (07/31/2009), DEL (08/12/2008)  Chol:264 (08/05/2010), 192 (07/31/2009)  Trig:62.0 (08/05/2010), 172.0 (07/31/2009)  Orders: Est. Patient Level IV (78469)  Medications Added to Medication List This Visit: 1)  Valtrex 1 Gm Tabs (Valacyclovir hcl) .Marland Kitchen.. 1 by mouth three times a day 2)  Prednisone 10 Mg Tabs (Prednisone) .... 4 tabs for 2 days, then 3 tabs for 2 days, 2 tabs for 2 days, then 1 tab for 2 days, then stop  Complete Medication List: 1)  Zyrtec Allergy 10 Mg Tabs (Cetirizine hcl) .... Take 1 tab by mouth once daily in the am... 2)  Nasacort Aq 55 Mcg/act Aers (Triamcinolone acetonide(nasal)) .... 2 sprays in each nostril at bedtime.Marland Kitchen 3)  Singulair 10 Mg Tabs (Montelukast sodium) .... Take 1 tab by mouth once daily.Marland KitchenMarland Kitchen 4)  Crestor 20 Mg Tabs (Rosuvastatin calcium) .... Take 1/2 tab by mouth at bedtime 5)  Pantoprazole Sodium 40 Mg Tbec (Pantoprazole sodium) .... Take one tablet by mouth once daily 6)  Advil 200 Mg Tabs (Ibuprofen) .... As needed 7)  Multivitamins Tabs (Multiple vitamin) .... Take 1 tablet by mouth once a day 8)  Vyvanse 60 Mg Caps (Lisdexamfetamine dimesylate) .Marland Kitchen.. 1 by mouth once daily 9)  Androgel Pump 1 % Gel (Testosterone) .... Apply once daily as directed by dr. Ferne Reus davis 10)   Lorazepam 1 Mg Tabs (Lorazepam) .... Take one tablet by mouth at bedtime as needed for sleep 11)  Valtrex 1 Gm Tabs (Valacyclovir hcl) .Marland Kitchen.. 1 by mouth three times a day 12)  Prednisone 10 Mg Tabs (Prednisone) .... 4 tabs for 2 days, then 3 tabs for 2 days, 2 tabs for 2 days, then 1 tab for 2 days, then stop  Patient Instructions: 1)  Begin Valtrex three times a day for 7 days  2)  Prednisone taper over next week.  3)  Vicodin 1 by mouth 4-6 hrs as needed pain  4)  Low fat/cholestrol diet.  5)  follow up Dr. Kriste Basque in 3 months with fasting labs.  6)  follow up 2 weeks Parrett NP .  7)  Please contact office for sooner follow up if symptoms do not improve or worsen  Prescriptions: PREDNISONE 10 MG TABS (PREDNISONE) 4 tabs for 2 days, then 3 tabs for  2 days, 2 tabs for 2 days, then 1 tab for 2 days, then stop  #20 x 0   Entered and Authorized by:   Rubye Oaks NP   Signed by:   Rubye Oaks NP on 08/19/2010   Method used:   Electronically to        [facility]* (retail)       [address]       [place], [place]  [address]       Ph: [phone]       Fax: [phone]   RxID:   [id number] VALTREX 1 GM TABS (VALACYCLOVIR HCL) 1 by mouth three times a day  #21 x 0   Entered and Authorized by:   Rubye Oaks NP   Signed by:   Rubye Oaks NP on 08/19/2010   Method used:   Electronically to        [facility]* (retail)       [address]       [place], [place]  [address]       Ph: [phone]       Fax: [phone]   RxID:   [id number]

## 2010-09-24 NOTE — Progress Notes (Signed)
  Phone Note Other Incoming   Request: Send information Summary of Call: Records received from Dr. Earlene Plater with [facility].16 pages forwarded to Dr. Kriste Basque for review.

## 2010-09-24 NOTE — Progress Notes (Signed)
Summary: rx req / shingles pain  Phone Note Call from Patient   Caller: Patient Call For: [facility] PARRETT Summary of Call: pt says his shingles are "very painful". [facility]. pt # [id number] Initial call taken by: Tivis Ringer, CNA,  September 01, 2010 11:52 AM  Follow-up for Phone Call        PT informed that rx was called to pharmacy.  Pt wants to let Tammy know that he is going to try Accupuncture also for th pain.  Pt is also requesting a refill on Lunesta 3mg .  Pt states that he has taken this in the past and would like to try it again.  Please if ok to give Lunesta. Abigail Miyamoto RN  September 01, 2010 1:02 PM   Additional Follow-up for Phone Call Additional follow up Details #1::        that is fine for accupuncture can have Lunesta 3mg  1 by mouth at bedtime as needed sleep, #30, no refills  come for follow up , if still painful can start  lyrica (post herpetic neuralgia) Please contact office for sooner follow up if symptoms do not improve or worsen  Additional Follow-up by: Rubye Oaks NP,  September 01, 2010 2:17 PM    Additional Follow-up for Phone Call Additional follow up Details #2::    Spoke with pt and notified of the above recs per TP.  Pt verbalized understanding. Rx was called to [facility] for [place].  Follow-up by: Vernie Murders,  September 01, 2010 2:27 PM  New/Updated Medications: VICODIN 5-500 MG TABS (HYDROCODONE-ACETAMINOPHEN) Take one tablet every 4 - 6 hrs as needed pain LUNESTA 3 MG TABS (ESZOPICLONE) 1 by mouth at bedtime as needed Prescriptions: LUNESTA 3 MG TABS (ESZOPICLONE) 1 by mouth at bedtime as needed  #30 x 0   Entered by:   Vernie Murders   Authorized by:   Rubye Oaks NP   Signed by:   Vernie Murders on 09/01/2010   Method used:   Telephoned to ...       [facility]* (retail)       [address]       [place], [place]  [id number]       Ph: [phone]       Fax: [phone]   RxID:   [id number] VICODIN 5-500  MG TABS (HYDROCODONE-ACETAMINOPHEN) Take one tablet every 4 - 6 hrs as needed pain  #20 x 0   Entered by:   Abigail Miyamoto RN   Authorized by:   Rubye Oaks NP   Signed by:   Abigail Miyamoto RN on 09/01/2010   Method used:   Telephoned to ...       [facility]* (retail)       [address]       [place], [place]  [address]       Ph: [phone]       Fax: [phone]   RxID:   [id number]

## 2010-09-24 NOTE — Letter (Signed)
[facility]: [facility]   Imported By: Sherian Rein 08/14/2010 10:45:32  _____________________________________________________________________  External Attachment:    Type:   Image     Comment:   External Document

## 2010-09-24 NOTE — Progress Notes (Signed)
Summary: ? shingles > UC  Phone Note Call from Patient Call back at Home Phone [phone]   Caller: Spouse/MARY Call For: DR. NADEL Summary of Call: They are on their way back to town in [place] now and thinks that he has shingles. He has a small rash on his abdomen going all the way around to his spine left side. They want to know if he can be seen this afternoon they can be reached on their cell phone [phone] States if they can't be seen here they will go to urgent care but would rather come here.  Initial call taken by: Vedia Coffer,  August 18, 2010 4:14 PM  Follow-up for Phone Call        called spoke with patient who states he noticed some pain on the left side of chest x2-3days and developed a rash last evening that is slowly wrapping itself around to the left side of his back.  believes this to be shingles.  SN has no openings today and TP's first opening is tomorrow morning at 1045.  pt unable to make appt tomorrow morning d/t scheduling conflict w/ his work.  pt asked if it would be okay if he went to an UC.  i advised pt that yes, this would be fine.  that they will treat patient same as we would if it is confirmed shingles and probably have him folliow up here.  pt okay with this and verbalized his understanding.  encouraged pt to call if he changes his mind about seeing TP. Follow-up by: Boone Master CNA/MA,  August 18, 2010 4:31 PM

## 2010-11-28 LAB — URINALYSIS, ROUTINE W REFLEX MICROSCOPIC
Glucose, UA: NEGATIVE mg/dL
Ketones, ur: NEGATIVE mg/dL
Leukocytes, UA: NEGATIVE
Nitrite: NEGATIVE
Specific Gravity, Urine: 1.026 (ref 1.005–1.030)
pH: 5.5 (ref 5.0–8.0)

## 2010-11-28 LAB — URINE MICROSCOPIC-ADD ON

## 2010-11-28 LAB — COMPREHENSIVE METABOLIC PANEL
ALT: 47 U/L (ref 0–53)
BUN: 16 mg/dL (ref 6–23)
CO2: 26 mEq/L (ref 19–32)
Chloride: 108 mEq/L (ref 96–112)
Glucose, Bld: 127 mg/dL — ABNORMAL HIGH (ref 70–99)
Potassium: 4.1 mEq/L (ref 3.5–5.1)

## 2010-11-28 LAB — APTT: aPTT: 24 seconds (ref 24–37)

## 2010-11-28 LAB — CBC
HCT: 43.3 % (ref 39.0–52.0)
Hemoglobin: 15.1 g/dL (ref 13.0–17.0)
RDW: 12.4 % (ref 11.5–15.5)
WBC: 5.8 10*3/uL (ref 4.0–10.5)

## 2011-01-05 NOTE — Op Note (Signed)
NAME:  Darrell Fuller, GAUSE NO.:  [id number]   MEDICAL RECORD NO.:  [id number]          PATIENT TYPE:  OIB   LOCATION:  0098                         FACILITY:  [facility]   PHYSICIAN:  Jene Every, M.D.    DATE OF BIRTH:  [birth year]   DATE OF PROCEDURE:  04/24/2009  DATE OF DISCHARGE:                               OPERATIVE REPORT   PREOPERATIVE DIAGNOSIS:  Spinal stenosis L4-5 right, scoliosis.   POSTOPERATIVE DIAGNOSIS:  Spinal stenosis L4-5 right, scoliosis.   PROCEDURE:  1. Lateral recess decompression L4-5.  2. Foraminotomy of L5 microscopic decompression.   ANESTHESIA:  General.   ASSISTANT:  Roma Schanz, P.A.   INDICATIONS FOR PROCEDURE:  This is a 50 year old male with refractory  right lower extremity radicular pain L5 nerve root distribution  secondary to lateral recess stenosis at 4-5 combined with scoliosis  producing a dynamic nerve root compression.  He had an MRI indicating  lateral recess stenosis at 4-5 and this was confirmed with temporary  relief from a selective nerve root block at L5.  He was indicated  therefore for decompression at L5.  Risks and benefits discussed  including bleeding, infection, damage to neurovascular structures, CSF  leakage, epidural fibrosis, adjacent segment disease, need for fusion in  the future and anesthetic complications, etc.   PROCEDURE IN DETAIL:  With the patient in supine position after  induction of general anesthesia and 1 gram of Kefzol he was placed prone  on the table.  All bony prominences well-padded.  Lumbar region was  prepped and draped in the usual sterile fashion.  Two 18 gauge spinal  needles were utilized to localize the 4-5 interspace confirmed with x-  ray.  Incision was made from the spinous process of 4 to 5.  Subcutaneous tissue was dissected and electrocautery was utilized to  achieve hemostasis.  Dorsolumbar fascia was identified and divided in  line with the skin incision.   Paraspinous muscle elevated from the  lamina of 4 and 5.  Operating microscope draped and brought into the  surgical field.  A small interlaminar window was noted with a  hypertrophic facet.  Ligamentum flavum was detached from the cephalad  edge of 5 with a straight curette.  Neural patty placed beneath the  ligamentum flavum.  Foraminotomy of 5 was performed.  The remaining  ligamentum flavum removed from the interspace.  The lateral recess was  severely stenotic.  We decompressed the lateral recess, the medial  border of the pedicle sparing the L5 root.  The nerve root was gently  mobilized medially.   Following decompression I examined cephalad and caudad.  There was no  residual compression upon the 5 root.  There was no disk herniation.  Hockey stick probe passed freely up the foramen of 5 and of 4.  There  was at least 1 cm excursion of 5 root medial to the pedicle.   The disk space was copiously irrigated with antibiotic irrigation.  Hockey stick probe first placed in the foramen of 5 and then the foramen  of 5 and 4 was confirmed  by x-ray.  There was residual from the  selective nerve root block noted as well.  Wound copiously irrigated.  Thrombin-soaked Gelfoam was placed in laminotomy defect.  McCullough  retractors removed.  Paraspinous muscles inspected.  No active bleeding.  Dorsolumbar fascia reapproximated with 1-0 Vicryl interrupted figure-of-  eight sutures, subcu with 2-0 Vicryl simple sutures and skin was  reapproximated with 4-0 subcuticular Prolene.  The wound reinforced with  Steri-Strips.  Sterile dressing applied.  The patient was placed supine  on the hospital bed, extubated without difficulty and transported to the  recovery room in satisfactory condition.  The patient tolerated the  procedure without complications.  Minimal blood loss.      Jene Every, M.D.  Electronically Signed     JB/MEDQ  D:  04/24/2009  T:  04/24/2009  Job:  [id number]

## 2011-01-08 NOTE — Assessment & Plan Note (Signed)
[facility]                         GASTROENTEROLOGY OFFICE NOTE   Darrell Fuller, Darrell Fuller                     MRN:          [id number]  DATE:12/28/2006                            DOB:          22-Jun-1961    This is a return office visit for abnormal transaminases.  Followup  blood work on December 08, 2006 revealed a normal AST at 36 and a mildly  elevated ALT at 60.  Additional blood tests were all negative and his  abdominal ultrasound was negative.  He has ongoing problems with  heartburn and mild right upper quadrant pain that have responded to  Prilosec.  He has no other gastrointestinal complaints.   CURRENT MEDICATIONS:  Listed on the chart, updated and reviewed.   MEDICATION ALLERGIES:  None known.   EXAMINATION:  No acute distress.  Weight 137.8 pounds, blood pressure is 110/70, pulse 84 and regular.  He  was not re-examined.   ASSESSMENT/PLAN:  1. Abnormal transaminases - resolving.  I suspect he had a transient      mild hepatic insult.  Repeat hepatic function panel today.  If this      is normalized, his hepatic function panel should just be checked      again in 6 months by Dr. Kriste Basque to document stability.  If his liver      function tests have not normalized we will plan for repeat hepatic      function panel in 2 months.  2. Gastroesophageal reflux disease.  He is encouraged to use Prilosec      on a daily basis and continue antireflux measures.  If his symptoms      do not fully resolve after 4-6 weeks of daily Prilosec, he is to      return for further followup.     Venita Lick. Russella Dar, MD, [facility]  Electronically Signed    MTS/MedQ  DD: 12/28/2006  DT: 12/28/2006  Job #: [id number]   cc:   Lonzo Cloud. Kriste Basque, MD

## 2011-01-08 NOTE — Assessment & Plan Note (Signed)
[facility]                         GASTROENTEROLOGY OFFICE NOTE   Darrell Fuller, Darrell Fuller                     MRN:          [id number]  DATE:12/08/2006                            DOB:          Mar 12, 1961    REASON FOR REFERRAL:  Elevated liver enzymes.   HISTORY OF PRESENT ILLNESS:  Darrell Fuller is a 50 year old white male that  I saw in the past.  He underwent colonoscopy for small volume  hematochezia in March of 2001 that was normal.  He was recently found to  have elevated transaminases with an AST of 244 and an ALT of 90.  He has  intermittent problems with heartburn and indigestion, and his symptoms  are controlled on p.r.n. Prilosec.  He has mild epigastric pain that is  occasionally associated with his heartburn and indigestion, and these  symptoms are chronic and recurrent.  He relates no jaundice, history of  hepatitis, history of exposure to people with jaundice or hepatitis,  recent blood transfusions, needle sticks, intravenous drug usage, or  tattoos.  He states he had vaccines for hepatitis A and B in about 2000  when he traveled to [place].  He was taking L-arginine 1-2 a day for  several weeks but discontinued this recently.  He has no weight loss,  nausea, vomiting, change in bowel habits or gastrointestinal bleeding.   PAST MEDICAL HISTORY:  1. Hyperlipidemia.  2. Anxiety.  3. Status post varicocele repair.  4. Status post vasectomy.  5. History of microscopic hematuria.   CURRENT MEDICATIONS:  Listed on the chart, updated and reviewed.   MEDICATION ALLERGIES:  NONE KNOWN.   SOCIAL HISTORY:  Per the handwritten form.   REVIEW OF SYSTEMS:  Per the handwritten form.   PHYSICAL EXAMINATION:  Well-developed, well-nourished white male in no  acute distress.  Height 5 feet 7 inches, weight 172.6 pounds, blood  pressure is 102/70, pulse 72 and regular.  HEENT:  Anicteric sclerae, oropharynx clear.  CHEST:  Clear to auscultation  bilaterally.  CARDIAC:  Regular rate and rhythm without murmurs appreciated.  ABDOMEN:  Soft, nontender, nondistended.  Normoactive bowel sounds.  No  palpable organomegaly, masses, or hernias.  Liver span is about 12  sonometers by percussion in the right mid-clavicular line.  EXTREMITIES:  Without clubbing, cyanosis, or edema.  NEUROLOGIC:  Alert and oriented x3, grossly nonfocal.   LABORATORY DATA:  Abdominal and pelvic CT scan from November 30, 2006 shows  a cyst in the right lobe of the liver measuring 11.6 mm and lumbar  spondylosis was noted.  The remainder of the study was negative.   ASSESSMENT/PLAN:  1. Elevated transaminases.  Etiology unclear.  Possible acute, mild      hepatic insult or a biliary process. Schedule an abdominal      ultrasound to evaluate for cholelithiasis although this was not      noted on the recent CT scan.  Obtain all standard serologies for      viral hepatitis and metabolic liver disease.  2. Gastroesophageal reflux disease with intermittent epigastric pain.      Re-intensify anti-reflux measures and  encourage more regular use of      Prilosec.  Return office visit in 3-4 weeks.     Venita Lick. Russella Dar, MD, [facility]  Electronically Signed    MTS/MedQ  DD: 12/15/2006  DT: 12/15/2006  Job #: [id number]   cc:   Lonzo Cloud. Kriste Basque, MD

## 2011-01-08 NOTE — Discharge Summary (Signed)
NAME:  Darrell Fuller, Darrell Fuller NO.:  [id number]   MEDICAL RECORD NO.:  [id number]                   PATIENT TYPE:  IPS   LOCATION:  0507                                 [facility]   PHYSICIAN:  Jeanice Lim, M.D.              DATE OF BIRTH:  20-Sep-1960   DATE OF ADMISSION:  06/13/2002  DATE OF DISCHARGE:  06/16/2002                                 DISCHARGE SUMMARY   IDENTIFYING DATA:  This is a 50 year old married Caucasian male with  suicidal thoughts for two weeks and depression with a plan to shoot himself  after wife found out about his affair.  The patient reported being a social  drinker, drinking four drinks a night and patient's wife apparently called  the police after he threatened to shoot himself.  The patient has a history  of depression in the distant past and a previous suicide attempt at age 79  via overdose.   MEDICATIONS:  Prior to admission were Paxil CR 25 mg (patient had been on it  for two months), Xanax.   ALLERGIES:  No known drug allergies.   PHYSICAL EXAMINATION:  Essentially within normal limits.  Neurologically  nonfocal.   LABORATORY DATA:  Routine admission labs within normal limits.   MENTAL STATUS EXAM:  Neatly dressed, good hygiene, 50 year old male  appearing stated age.  Alert and oriented.  Speech within normal limits.  Mood depressed and anxious.  Affect restricted.  Thought processes goal  directed.  Thought content positive for fleeting suicidal ideation with no  acute suicidal ideation.  Able to contract for safety in the hospital and no  psychotic symptoms.  Cognitively intact.  Judgment and insight fair to poor.   ADMISSION DIAGNOSES:   AXIS I:  1. Major depressive disorder, recurrent, severe with suicidal ideation.  2. Alcohol abuse.   AXIS II:  None.   AXIS III:  None.   AXIS IV:  Moderate (problems with primary support system, occupational  problems, other psychosocial issues).   AXIS V:   25/65.   HOSPITAL COURSE:  The patient was admitted and ordered routine p.r.n.  medications.  The patient requested to have HIV and RPR testing and to  resume Paxil targeting depressive symptoms.  The patient described multiple  stressors, including having four children under the age of 23.  He gradually  felt better with clinical intervention.  Felt that he would be able to work  things through with his wife.  The patient was appropriate on the unit.  Showed no dangerous ideation or behaviors and was fully cooperative.   CONDITION ON DISCHARGE:  Markedly improved.  Mood was more euthymic.  Affect  brighter.  Thought processes goal directed.  Thought content negative for  dangerous ideation or psychotic symptoms.   DISCHARGE MEDICATIONS:  Paxil CR 25 mg q.a.m.   FOLLOW UP:  Dr. Ave Filter and Dr. Kriste Basque and  [facility] if needed.   DISCHARGE DIAGNOSES:   AXIS I:  1. Major depressive disorder, recurrent, severe with suicidal ideation.  2. Alcohol abuse.   AXIS II:  None.   AXIS III:  None.   AXIS IV:  Moderate (problems with primary support system, occupational  problems, other psychosocial issues).   AXIS V:  Global Assessment of Functioning upon discharge 55-60.                                                 Jeanice Lim, M.D.    JEM/MEDQ  D:  07/10/2002  T:  07/10/2002  Job:  [id number]
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
## 2012-06-09 ENCOUNTER — Telehealth: Payer: Self-pay | Admitting: Pulmonary Disease

## 2012-06-09 DIAGNOSIS — K219 Gastro-esophageal reflux disease without esophagitis: Secondary | ICD-10-CM

## 2012-06-09 NOTE — Telephone Encounter (Signed)
Called and spoke with pt and he is aware that order has been placed for the pt to be set up with GI for routine colon.  Pt is aware.

## 2012-06-12 ENCOUNTER — Encounter: Payer: Self-pay | Admitting: Gastroenterology

## 2012-07-11 ENCOUNTER — Encounter: Payer: Self-pay | Admitting: *Deleted

## 2012-07-12 ENCOUNTER — Encounter: Payer: Self-pay | Admitting: Pulmonary Disease

## 2012-07-12 ENCOUNTER — Ambulatory Visit (INDEPENDENT_AMBULATORY_CARE_PROVIDER_SITE_OTHER)
Admission: RE | Admit: 2012-07-12 | Discharge: 2012-07-12 | Disposition: A | Discharge status: Home / Self Care | Payer: BC Managed Care – PPO | Source: Ambulatory Visit | Attending: Pulmonary Disease | Admitting: Pulmonary Disease

## 2012-07-12 ENCOUNTER — Other Ambulatory Visit (INDEPENDENT_AMBULATORY_CARE_PROVIDER_SITE_OTHER): Payer: BC Managed Care – PPO

## 2012-07-12 ENCOUNTER — Ambulatory Visit (INDEPENDENT_AMBULATORY_CARE_PROVIDER_SITE_OTHER): Payer: BC Managed Care – PPO | Admitting: Pulmonary Disease

## 2012-07-12 VITALS — BP 118/70 | HR 77 | Temp 97.0°F | Ht 66.0 in | Wt 186.4 lb

## 2012-07-12 DIAGNOSIS — Z0000 Encounter for general adult medical examination without abnormal findings: Secondary | ICD-10-CM

## 2012-07-12 DIAGNOSIS — F909 Attention-deficit hyperactivity disorder, unspecified type: Secondary | ICD-10-CM

## 2012-07-12 DIAGNOSIS — E785 Hyperlipidemia, unspecified: Secondary | ICD-10-CM

## 2012-07-12 DIAGNOSIS — M1990 Unspecified osteoarthritis, unspecified site: Secondary | ICD-10-CM

## 2012-07-12 DIAGNOSIS — E291 Testicular hypofunction: Secondary | ICD-10-CM

## 2012-07-12 DIAGNOSIS — K219 Gastro-esophageal reflux disease without esophagitis: Secondary | ICD-10-CM

## 2012-07-12 DIAGNOSIS — J209 Acute bronchitis, unspecified: Secondary | ICD-10-CM

## 2012-07-12 LAB — CBC WITH DIFFERENTIAL/PLATELET
Eosinophils Relative: 1.5 % (ref 0.0–5.0)
HCT: 46.4 % (ref 39.0–52.0)
Hemoglobin: 15.7 g/dL (ref 13.0–17.0)
Lymphs Abs: 2 10*3/uL (ref 0.7–4.0)
MCV: 93.6 fl (ref 78.0–100.0)
Monocytes Absolute: 0.8 10*3/uL (ref 0.1–1.0)
Neutro Abs: 4.3 10*3/uL (ref 1.4–7.7)
Platelets: 172 10*3/uL (ref 150.0–400.0)
RDW: 12.6 % (ref 11.5–14.6)
WBC: 7.3 10*3/uL (ref 4.5–10.5)

## 2012-07-12 LAB — HEPATIC FUNCTION PANEL
AST: 34 U/L (ref 0–37)
Albumin: 4.3 g/dL (ref 3.5–5.2)
Alkaline Phosphatase: 61 U/L (ref 39–117)
Bilirubin, Direct: 0.1 mg/dL (ref 0.0–0.3)

## 2012-07-12 LAB — BASIC METABOLIC PANEL
CO2: 30 mEq/L (ref 19–32)
GFR: 82.17 mL/min (ref >60.00)
Glucose, Bld: 91 mg/dL (ref 70–99)
Potassium: 4.1 mEq/L (ref 3.5–5.1)
Sodium: 137 mEq/L (ref 135–145)

## 2012-07-12 LAB — LIPID PANEL
HDL: 47.7 mg/dL (ref >39.00)
Total CHOL/HDL Ratio: 7

## 2012-07-12 IMAGING — CR DG CHEST 2V
2 series · 2 of 2 positions shown · non-contrast
Comparison: [DATE]

CLINICAL DATA: Asthma

CHEST - 2 VIEW

[view not recorded (1 of 2)]
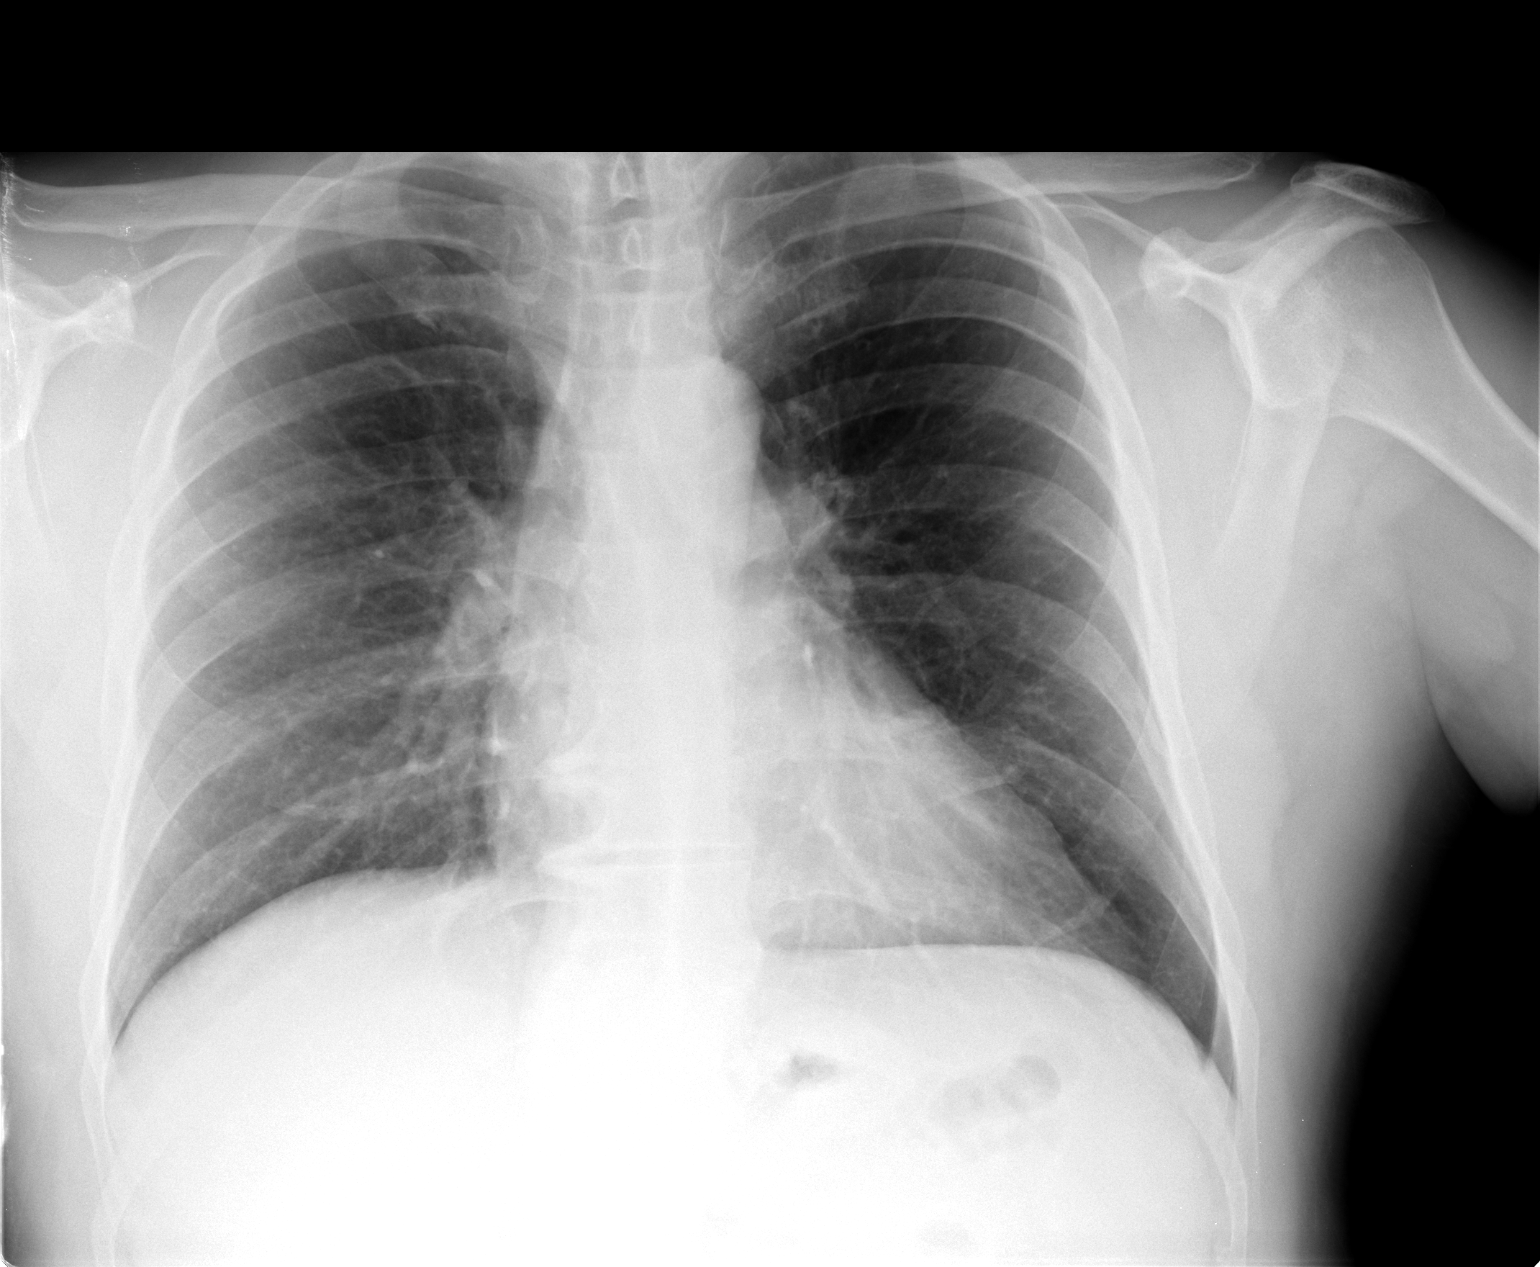

[view not recorded (2 of 2)]
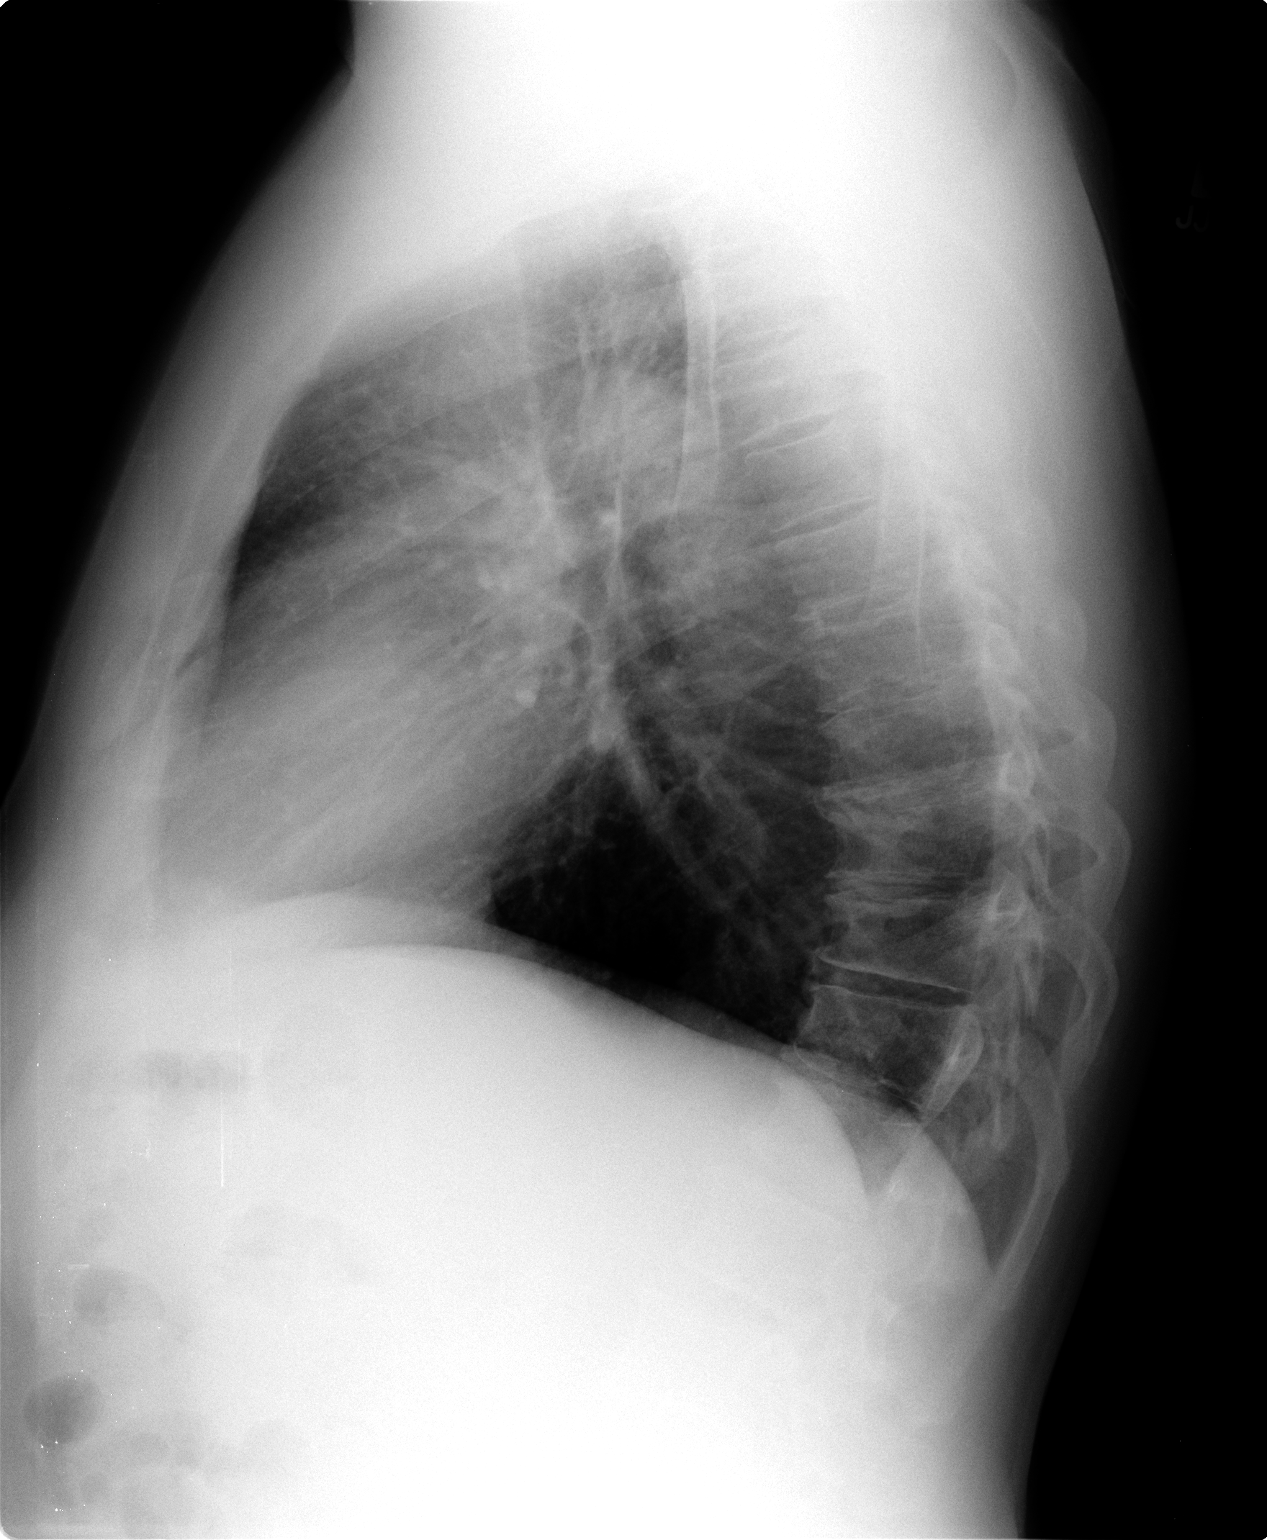

[2 of 2 positions shown; findings below may reference images not displayed]

FINDINGS: The heart is upper normal in size.  Lungs are clear.  No
pneumothorax.  No pleural effusion.  Chronic changes in the
thoracic spine.  No new compression deformity.
IMPRESSION: No active cardiopulmonary disease.

## 2012-07-12 MED ORDER — PANTOPRAZOLE SODIUM 40 MG PO TBEC: 40.0000 mg | DELAYED_RELEASE_TABLET | Freq: Every day | ORAL | Status: DC

## 2012-07-12 MED ORDER — LISDEXAMFETAMINE DIMESYLATE 50 MG PO CAPS: 50.0000 mg | ORAL_CAPSULE | ORAL | Status: DC

## 2012-07-12 MED ORDER — MONTELUKAST SODIUM 10 MG PO TABS: 10.0000 mg | ORAL_TABLET | Freq: Every day | ORAL | Status: DC

## 2012-07-12 NOTE — Patient Instructions (Signed)
Today we updated your med list in our EPIC system...    We wrote all new prescriptions as you requested...  Today we did your follow up CXR, EKG, FASTING blood work...    We will call you w/ the results when avail...  Good luck w/ the upcoming foot surg...  Call for any problems & let's plan a follow up eval in 1 year.Marland KitchenMarland Kitchen

## 2012-07-12 NOTE — Progress Notes (Signed)
Subjective:     Patient ID: Darrell Fuller, male   DOB: 1960-10-06, 51 y.o.   MRN: [id number]  HPI 51 y/o WM here for a follow up vsit...  he has multiple medical problems including hx AB; Hyperlipidemia; GERD; Low-T per Urology; DJD & back pain; and ADD on Vyvanse...   ~  July 30, 2009:  he saw Darrell Fuller 11/10 for Tremor- ? familial tremor, neg MRI per pt, tried Propranolol w/o much help... he is opening "[employer]" in [place]...   ~  August 05, 2010:  he's had a good yr- doing well w/ the bakery/ small business... feeling well, exercises regularly w/ MMA at [employer]rell Fuller w/ his kids, P90X, running 5Ks, etc... BP controlled,  no CP/ palpit/ SOB/ etc;  he stopped the Prav40 on his own (?diarrhea) & we discussed recheck FLP & consider low dose Crestor;  he's on Androgel from Urology & we will send for records and refill Rx;  he continues on Vyvanse for ADD & he wishes to continue- "it helps me focus"...  ~  July 12, 2012:  79yr ROV & CPX> Darrell Fuller returns after a 88yr hiatus, doing well w/o new complaints or concerns;  He is off all of his prev meds x Androgel 1.62%- 2pumps daily per Darrell Fuller;  But he wants to restart several of his prev meds> Singulair, Protonix, & Vyvanse...    <AR/ AB> on Singulair10; his has mild allergy symptoms treated w/ OTC antihist & nasal saline as needed; hx AB in the past & no recent issues but he likes the Singulair & it helps prev exercise induced symptoms...    <Hyperlipid> prev on Prav80 but not at goals, switched to Cres20 but never had f/u; he's been off all meds for ~71yr & good time to estab new baseline & discussed diet + Atorva if meds needed; FLP showed TChol 315, TG 354, HDL 48, LDL 184> start Atorva40...    <GERD> on Protonix40; he notes occas reflux symptoms but denies abd pain, n/v, c/d, blood seen; he has colonoscopy w/ Darrell Fuller sched for 07/31/12...    <Low-T> followed by Darrell Fuller on Androgel 1.62%- 2pumps; he tells me he had f/u eval several weeks  ago & his Testos level, PSA, & DRE were all OK- wnl...    <DJD, LBP, right foot problems> he uses Advil as needed; he developed signif right foot problems from his Darrell Fuller & running- saw Darrell Fuller w/ XRays & MRI- sched for foot surg 08/21/12...    <ADHD, ?memory issues> Darrell Fuller wants his Vyvanse refilled for his ADHD even though he's been w/o it for >61yr; "I need it to stay focused" he says; we discussed Vyvanse 50mg  & he will let us know how this works for him... He also notes some intermittent prob w/ memory- offered Neuro referral for full eval but he declines at this time & will let me know...    We reviewed prob list, meds, xrays and labs> see below for updates >> he declines the 2013 flu vaccine... CXR 11/13 showed borderline heart size, clear lungs, NAD.Marland KitchenMarland Kitchen EKG 11/13 showed NSR, rate74, WNL, NAD... LABS 11/13:  FLP- way off w/ [id number] TG354 YNW295;  Chems- wnl;  CBC- wnl;  TSH=2.33          Problem List:    ALLERGIC RHINITIS (ICD-477.9) - uses OTC antihist as needed + nasal saline for relief...  Hx of ASTHMATIC BRONCHITIS, ACUTE (ICD-466.0) - on SINGULAIR 10mg /d w/ good control & he is very active,  etc...  ~  CXR 3/10 was clear, WNL.Marland Kitchen. ~  CXR 11/13 showed borderline heart size, clear lungs, NAD...  HYPERLIPIDEMIA (ICD-272.4) - prev on Prav80 (he stopped on his own due to diarrhea)... now diet alone. ~  FLP 9/08 showed TChol 238, TG 164, HDL 40, LDL 159... he prefers to continue diet Rx. ~  FLP 12/09 showed TChol 250, TG 200, HDL 44, LDL 151... rec- start Simvastatin 20mg /d but pt states INTOL w/ itching/ rash and stopped it... changed to Pravastatin40 and tol well so far. ~  FLP 4/10 on Prav40 showed TChol 228, TG 99, HDL 50, LDL 147... rec> incr to Prav80... ~  FLP 12/10 on Prav80 showed TChol 192, TG 172, HDL 47, LDL 111... continue same for now. ~  FLP 12/11 on diet alone showed TChol 264, TG 62, HDL 55, LDL 186... needs Rx, start Cres20- 1/2 daily to start=> never followed up. ~   FLP 11/13 on diet alone showed TChol TChol 315, TG 354, HDL 48, LDL 184... We discussed starting ATORVASTATIN 40mg /d...  GERD (ICD-530.81) - prev used OTC Zantac as needed; he has intermittent reflux symptoms & responds to PROTONIX 40mg /d... ~  CT Abd 4/08 showed 1cm right liver cyst, otherw neg and WNL. ~  Abd Sonar 5/08 showed norm GB/ ducts, etc... study was WNL. ~  colonoscopy 3/01 by Darrell Fuller done for hematochezia was totally WNL...  ~  11/13:  He is sched for f/u colonoscopy by Darrell Fuller 07/31/12...  LIVER FUNCTION TESTS, ABNORMAL, HX OF (ICD-V12.2) - transient elevation of LFTs in 2008... resolved to normal w/ conservative approach, no alcohol, etc... ~  Labs 11/13:  LFTs remain wnl...  TESTOSTERONE DEFICIENCY (ICD-257.2) - he is followed by Urology & doing satis by his report... hx of varicocele repair, vasectomy, and microhematuria eval in the past...on Rx w/ ANDROGEL 1.62%- 2pumps daily... ~  Testos level from Darrell Fuller showed 11/09=224 ([phone]), 12/09=327, 7/10=338 ~  PSA level from Darrell Fuller showed 12/09=1.98 ~  PSA here 12/11 = 1.65 ~  11/13:  He reports recent Urology f/u by Darrell Fuller (we don't have notes); pt states that his Testos level, PSA, & DRE were all ok- wnl...  DEGENERATIVE JOINT DISEASE (ICD-715.90) - he takes Advil Prn... right shoulder pain evaluated and treated w/ shots per Darrell Fuller, then required surg- improved...  ~  11/13:  He tells me that his marshal arts & running caused tendon problems in his right foot & he's had eval w/ XRays & MRI from Darrell Fuller; sched for surgery 08/21/12...  BACK PAIN, LUMBAR (ICD-724.2) - he has hx lumbar & cervical spondylosis on XRays in the past... s/p lumbar spine surg 9/10 by Darrell Fuller...  ATTENTION DEFICIT HYPERACTIVITY DISORDER, ADULT (ICD-314.01) - on VYVANSE 60mg /d, he says it helps his ADD & helps him to stay focused... prev on Concerta per Darrell Fuller, & he requested change to Vyvanse 6/10... ~  Dec09:  he wants me to take over writing  for his Concerta 54mg /d for his ADHD prev written by Darrell Fuller... he notes that he is quite stable w/ this med- it works well without side effects... he takes it [place] thru Fri usually and off for the weekends, and occas drug holidays...  ~  2010:  reports doing well on the Concerta & needs Rx refilled> subseq called asking for change to Vyvanse... ~  2011:  stable on the Vyvanse 60mg /d for his ADHD & he wants to continue same. ~  11/13:  Despite being off the Vyvanse for >80yr, he wishes to  restart this med "to stay focused"; we discussed trial VYVANSE 50mg /d as needed... NOTE> he also c/o some ?memory loss & he is offered Neuro referral for full eval but he declines & will let me know...  ANXIETY (ICD-300.00) - aware, he is not on specific medication for this & notes that he is doing quite well actually...   Past Medical History  Diagnosis Date  . Allergic rhinitis   . Asthmatic bronchitis   . Hyperlipidemia   . GERD (gastroesophageal reflux disease)   . Abnormal liver function test   . Testosterone deficiency   . DJD (degenerative joint disease)   . Lumbar back pain   . Attention deficit hyperactivity disorder   . Anxiety     Past Surgical History  Procedure Date  . Varicocele repair and vasectomy     Dr. Darvin Neighbours  . Right shoulder surgery     Dr. Amanda Pea  . Lumbar spine surgery 04/2009    Dr. Shelle Iron    Outpatient Encounter Prescriptions as of 07/12/2012  Medication Sig Dispense Refill  . lisdexamfetamine (VYVANSE) 50 MG capsule Take 1 capsule (50 mg total) by mouth every morning.  90 capsule  0  . montelukast (SINGULAIR) 10 MG tablet Take 1 tablet (10 mg total) by mouth at bedtime.  90 tablet  3  . pantoprazole (PROTONIX) 40 MG tablet Take 1 tablet (40 mg total) by mouth daily.  90 tablet  3  . Testosterone (ANDROGEL PUMP) 20.25 MG/ACT (1.62%) GEL 2 pumps into the skin daily per Dr. Laverle Patter      . [DISCONTINUED] lisdexamfetamine (VYVANSE) 50 MG capsule Take 50 mg by mouth every  morning.      . [DISCONTINUED] montelukast (SINGULAIR) 10 MG tablet Take 10 mg by mouth at bedtime.      . [DISCONTINUED] pantoprazole (PROTONIX) 40 MG tablet Take 40 mg by mouth daily.        Allergies  Allergen Reactions  . Pravastatin     REACTION: intol  . Simvastatin     REACTION: pt states INTOL w/ itching/ rash    Current Medications, Allergies, Past Medical History, Past Surgical History, Family History, and Social History were reviewed in [employer] record.   Review of Systems         See HPI - all other systems neg except as noted...  The patient denies anorexia, fever, weight loss, weight gain, vision loss, decreased hearing, hoarseness, chest pain, syncope, dyspnea on exertion, peripheral edema, prolonged cough, headaches, hemoptysis, abdominal pain, melena, hematochezia, severe indigestion/heartburn, hematuria, incontinence, muscle weakness, suspicious skin lesions, transient blindness, difficulty walking, depression, unusual weight change, abnormal bleeding, enlarged lymph nodes, and angioedema.     Objective:   Physical Exam    WD, WN, 51 y/o WM in NAD... GENERAL:  Alert & oriented; pleasant & cooperative... HEENT:  [place]/AT, EOM-wnl, PERRLA, Fundi-benign, EACs-clear, TMs-wnl, NOSE-clear, THROAT-clear & wnl. NECK:  Supple w/ fairROM; no JVD; normal carotid impulses w/o bruits; no thyromegaly or nodules palpated; no lymphadenopathy. CHEST:  Clear to P & A; without wheezes/ rales/ or rhonchi. HEART:  Regular Rhythm; without murmurs/ rubs/ or gallops. ABDOMEN:  Soft & nontender; normal bowel sounds; no organomegaly or masses detected. EXT: without deformities or arthritic changes; no varicose veins/ venous insuffic/ or edema. NEURO:  CN's intact;  no focal neuro deficits, fine resting tremor right > left , neg tinels, neg phalens  nml gait, equal strength bilaterally.  DERM:  No lesions noted; no rash etc..Marland Kitchen  RADIOLOGY DATA:  Reviewed in EPIC EMR  & discussed w/ the patient...  LABORATORY DATA:  Reviewed in EPIC EMR & discussed w/ the patient...   Assessment:     CPX >>   AR, AB>  Breathing is stable w/o exac; he wants Singulair refilled- ok 10mg /d as needed...  Hyperlipid>  He has been off all meds for >65yr; we discussed low chol, low fat diet & f/u FLP showed TChol315, TG354, LDL184> to start ATORVA40.  GERD>  On Protonix 40mg /d for his reflux symptoms...  Hypogonadism>  On Androgel 1.62% by Darrell Fuller & doing satis by his Hx; he will continue Urology f/u regularly...  DJD, Hx back pain, Hx shoulder surg, right foot pain>  Followed by Volney Presser w/ right foot surg planned 08/21/12...  Other medical issues as noted...     Plan:     Patient's Medications  New Prescriptions   No medications on file  Previous Medications   TESTOSTERONE (ANDROGEL PUMP) 20.25 MG/ACT (1.62%) GEL    2 pumps into the skin daily per Dr. Laverle Patter  Modified Medications   Modified Medication Previous Medication   LISDEXAMFETAMINE (VYVANSE) 50 MG CAPSULE lisdexamfetamine (VYVANSE) 50 MG capsule      Take 1 capsule (50 mg total) by mouth every morning.    Take 50 mg by mouth every morning.   MONTELUKAST (SINGULAIR) 10 MG TABLET montelukast (SINGULAIR) 10 MG tablet      Take 1 tablet (10 mg total) by mouth at bedtime.    Take 10 mg by mouth at bedtime.   PANTOPRAZOLE (PROTONIX) 40 MG TABLET pantoprazole (PROTONIX) 40 MG tablet      Take 1 tablet (40 mg total) by mouth daily.    Take 40 mg by mouth daily.  Discontinued Medications   No medications on file

## 2012-07-13 LAB — LDL CHOLESTEROL, DIRECT: Direct LDL: 183.5 mg/dL

## 2012-07-14 ENCOUNTER — Other Ambulatory Visit: Payer: Self-pay | Admitting: Pulmonary Disease

## 2012-07-14 MED ORDER — ATORVASTATIN CALCIUM 40 MG PO TABS: 40.0000 mg | ORAL_TABLET | Freq: Every day | ORAL | Status: DC

## 2012-07-14 MED ORDER — MONTELUKAST SODIUM 10 MG PO TABS: 10.0000 mg | ORAL_TABLET | Freq: Every day | ORAL | Status: DC

## 2012-07-14 MED ORDER — PANTOPRAZOLE SODIUM 40 MG PO TBEC: 40.0000 mg | DELAYED_RELEASE_TABLET | Freq: Every day | ORAL | Status: DC

## 2012-07-14 MED ORDER — LISDEXAMFETAMINE DIMESYLATE 50 MG PO CAPS: 50.0000 mg | ORAL_CAPSULE | ORAL | Status: DC

## 2012-07-17 ENCOUNTER — Ambulatory Visit (AMBULATORY_SURGERY_CENTER): Payer: BC Managed Care – PPO | Admitting: *Deleted

## 2012-07-17 VITALS — Ht 66.0 in | Wt 182.2 lb

## 2012-07-17 DIAGNOSIS — Z1211 Encounter for screening for malignant neoplasm of colon: Secondary | ICD-10-CM

## 2012-07-17 MED ORDER — MOVIPREP 100 G PO SOLR: ORAL | Status: DC

## 2012-07-18 ENCOUNTER — Encounter: Payer: Self-pay | Admitting: Gastroenterology

## 2012-07-31 ENCOUNTER — Ambulatory Visit (AMBULATORY_SURGERY_CENTER): Payer: BC Managed Care – PPO | Admitting: Gastroenterology

## 2012-07-31 ENCOUNTER — Encounter: Payer: Self-pay | Admitting: Gastroenterology

## 2012-07-31 VITALS — BP 114/83 | HR 75 | Temp 98.1°F | Resp 17 | Ht 66.0 in | Wt 182.0 lb

## 2012-07-31 DIAGNOSIS — Z1211 Encounter for screening for malignant neoplasm of colon: Secondary | ICD-10-CM

## 2012-07-31 MED ORDER — SODIUM CHLORIDE 0.9 % IV SOLN: 500.0000 mL | INTRAVENOUS | Status: DC

## 2012-07-31 NOTE — Progress Notes (Addendum)
Patient did not have preoperative order for IV antibiotic SSI prophylaxis. (G8918)  Patient did not experience any of the following events: a burn prior to discharge; a fall within the facility; wrong site/side/patient/procedure/implant event; or a hospital transfer or hospital admission upon discharge from the facility. (G8907)  

## 2012-07-31 NOTE — Op Note (Signed)
[facility] [address] [place], [address]   COLONOSCOPY PROCEDURE REPORT  PATIENT: Darrell Fuller, Darrell Fuller.  MR#: [id number] BIRTHDATE: November 26, 1960 , 51  yrs. old GENDER: Male ENDOSCOPIST: Meryl Dare, MD, [address] [facility] PROCEDURE DATE:  07/31/2012 PROCEDURE:   Colonoscopy, screening ASA CLASS:   Class II INDICATIONS:average risk screening. MEDICATIONS: MAC sedation, administered by CRNA and propofol (Diprivan) 250mg  IV DESCRIPTION OF PROCEDURE:   After the risks benefits and alternatives of the procedure were thoroughly explained, informed consent was obtained.  A digital rectal exam revealed no abnormalities of the rectum.   The LB [id number]  endoscope was introduced through the anus and advanced to the cecum, which was identified by both the appendix and ileocecal valve. No adverse events experienced.   The quality of the prep was excellent, using MoviPrep  The instrument was then slowly withdrawn as the colon was fully examined.  COLON FINDINGS: A normal appearing cecum, ileocecal valve, and appendiceal orifice were identified.  The ascending, hepatic flexure, transverse, splenic flexure, descending, sigmoid colon and rectum appeared unremarkable.  No polyps or cancers were seen. Retroflexed views revealed no abnormalities. The time to cecum=1 minutes 04 seconds.  Withdrawal time=7 minutes 31 seconds.  The scope was withdrawn and the procedure completed.  COMPLICATIONS: There were no complications.  ENDOSCOPIC IMPRESSION: 1.  Normal colon  RECOMMENDATIONS: 1.  Continue current colorectal screening recommendations for "routine risk" patients with a repeat colonoscopy in 10 years.   eSigned:  Meryl Dare, MD, [facility] 07/31/2012 11:38 AM

## 2012-07-31 NOTE — Patient Instructions (Addendum)
YOU HAD AN ENDOSCOPIC PROCEDURE TODAY AT THE  ENDOSCOPY CENTER: Refer to the procedure report that was given to you for any specific questions about what was found during the examination.  If the procedure report does not answer your questions, please call your gastroenterologist to clarify.  If you requested that your care partner not be given the details of your procedure findings, then the procedure report has been included in a sealed envelope for you to review at your convenience later.  YOU SHOULD EXPECT: Some feelings of bloating in the abdomen. Passage of more gas than usual.  Walking can help get rid of the air that was put into your GI tract during the procedure and reduce the bloating. If you had a lower endoscopy (such as a colonoscopy or flexible sigmoidoscopy) you may notice spotting of blood in your stool or on the toilet paper. If you underwent a bowel prep for your procedure, then you may not have a normal bowel movement for a few days.  DIET: Your first meal following the procedure should be a light meal and then it is ok to progress to your normal diet.  A half-sandwich or bowl of soup is an example of a good first meal.  Heavy or fried foods are harder to digest and may make you feel nauseous or bloated.  Likewise meals heavy in dairy and vegetables can cause extra gas to form and this can also increase the bloating.  Drink plenty of fluids but you should avoid alcoholic beverages for 24 hours.  ACTIVITY: Your care partner should take you home directly after the procedure.  You should plan to take it easy, moving slowly for the rest of the day.  You can resume normal activity the day after the procedure however you should NOT DRIVE or use heavy machinery for 24 hours (because of the sedation medicines used during the test).    SYMPTOMS TO REPORT IMMEDIATELY: A gastroenterologist can be reached at any hour.  During normal business hours, 8:30 AM to 5:00 PM Monday through Friday,  call [phone].  After hours and on weekends, please call the GI answering service at [phone] who will take a message and have the physician on call contact you.   Following lower endoscopy (colonoscopy or flexible sigmoidoscopy):  Excessive amounts of blood in the stool  Significant tenderness or worsening of abdominal pains  Swelling of the abdomen that is new, acute  Fever of 100F or higher  FOLLOW UP: If any biopsies were taken you will be contacted by phone or by letter within the next 1-3 weeks.  Call your gastroenterologist if you have not heard about the biopsies in 3 weeks.  Our staff will call the home number listed on your records the next business day following your procedure to check on you and address any questions or concerns that you may have at that time regarding the information given to you following your procedure. This is a courtesy call and so if there is no answer at the home number and we have not heard from you through the emergency physician on call, we will assume that you have returned to your regular daily activities without incident.  SIGNATURES/CONFIDENTIALITY: You and/or your care partner have signed paperwork which will be entered into your electronic medical record.  These signatures attest to the fact that that the information above on your After Visit Summary has been reviewed and is understood.  Full responsibility of the confidentiality of this   discharge information lies with you and/or your care-partner.   Thank-you for choosing us for your healthcare needs. 

## 2012-08-01 ENCOUNTER — Telehealth: Payer: Self-pay | Admitting: *Deleted

## 2012-08-01 NOTE — Telephone Encounter (Signed)
  Follow up Call-  Call back number 07/31/2012  Post procedure Call Back phone  # [phone]  Permission to leave phone message Yes     Patient questions:  Do you have a fever, pain , or abdominal swelling? no Pain Score  0 *  Have you tolerated food without any problems? yes  Have you been able to return to your normal activities? yes  Do you have any questions about your discharge instructions: Diet   no Medications  no Follow up visit  no  Do you have questions or concerns about your Care? no  Actions: * If pain score is 4 or above: No action needed, pain <4.

## 2012-12-29 ENCOUNTER — Telehealth: Payer: Self-pay | Admitting: Pulmonary Disease

## 2012-12-29 DIAGNOSIS — R002 Palpitations: Secondary | ICD-10-CM

## 2012-12-29 DIAGNOSIS — R0602 Shortness of breath: Secondary | ICD-10-CM

## 2012-12-29 NOTE — Telephone Encounter (Signed)
Order placed. Pt spouse is aware. Carron Curie, CMA

## 2012-12-29 NOTE — Telephone Encounter (Signed)
Spoke with patients spouse-- she states patient is having Heart palpitation in which they have gotten worse Wakes patient up out of his sleep Walking up stairs causes SOB Dr. Lucy Antigua MD highly reccommended stress test for patient Spouse states he really needs this done esp d/t family hx of heart disease Dr. Kriste Basque please advise if stress test may be ordered for patient, thank you!  Last OV: 07/12/12 w 1 yr f/u not scheduled at this time

## 2012-12-29 NOTE — Telephone Encounter (Signed)
Per SN----  Will need cardiology eval for this and they will be able to order the stress test.  thanks

## 2013-01-10 ENCOUNTER — Encounter: Payer: Self-pay | Admitting: Cardiovascular Disease

## 2013-01-10 ENCOUNTER — Encounter: Payer: Self-pay | Admitting: *Deleted

## 2013-01-10 ENCOUNTER — Ambulatory Visit (INDEPENDENT_AMBULATORY_CARE_PROVIDER_SITE_OTHER)
Admission: RE | Admit: 2013-01-10 | Discharge: 2013-01-10 | Disposition: A | Discharge status: Home / Self Care | Payer: BC Managed Care – PPO | Source: Ambulatory Visit | Attending: Cardiovascular Disease | Admitting: Cardiovascular Disease

## 2013-01-10 ENCOUNTER — Encounter (INDEPENDENT_AMBULATORY_CARE_PROVIDER_SITE_OTHER): Payer: BC Managed Care – PPO

## 2013-01-10 ENCOUNTER — Ambulatory Visit (INDEPENDENT_AMBULATORY_CARE_PROVIDER_SITE_OTHER): Payer: BC Managed Care – PPO | Admitting: Cardiovascular Disease

## 2013-01-10 VITALS — BP 132/89 | HR 79 | Ht 66.0 in | Wt 184.8 lb

## 2013-01-10 DIAGNOSIS — R079 Chest pain, unspecified: Secondary | ICD-10-CM

## 2013-01-10 DIAGNOSIS — R002 Palpitations: Secondary | ICD-10-CM

## 2013-01-10 DIAGNOSIS — E785 Hyperlipidemia, unspecified: Secondary | ICD-10-CM

## 2013-01-10 DIAGNOSIS — R0602 Shortness of breath: Secondary | ICD-10-CM

## 2013-01-10 DIAGNOSIS — R0609 Other forms of dyspnea: Secondary | ICD-10-CM

## 2013-01-10 DIAGNOSIS — R0989 Other specified symptoms and signs involving the circulatory and respiratory systems: Secondary | ICD-10-CM

## 2013-01-10 DIAGNOSIS — R0600 Dyspnea, unspecified: Secondary | ICD-10-CM | POA: Insufficient documentation

## 2013-01-10 IMAGING — CT CT HEART SCORING
1 of 3 series · 10 of 20 positions shown, 13 images · non-contrast
Comparison: None

***ADDENDUM*** CREATED: [DATE] [DATE]

OVER-READ INTERPRETATION - CT CHEST
The following report is an over-read performed by radiologist Dr.
[DATE].  This over-read does not include interpretation of
cardiac or coronary anatomy or pathology.  The coronary calcium
score interpretation by the cardiologist is attached.
INDICATION: Risk stratification
PROTOCOL: The patient was scanned on a Siemens Sensation 16 slice
scanner. 3mm non contrast axial slices were performed through the
heart.  The data was sent to a [employer] work station for scoring
using the Agatson method

[Series 6: st thins for reformat · axial · 0.72mm/px · z∈[-216,-115]mm · 10 of 125 slices shown, 13 images]
[im 12/125  vessel]
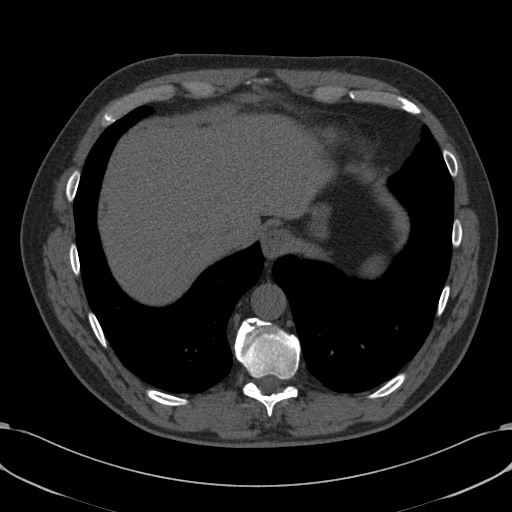
[im 12/125  lung]
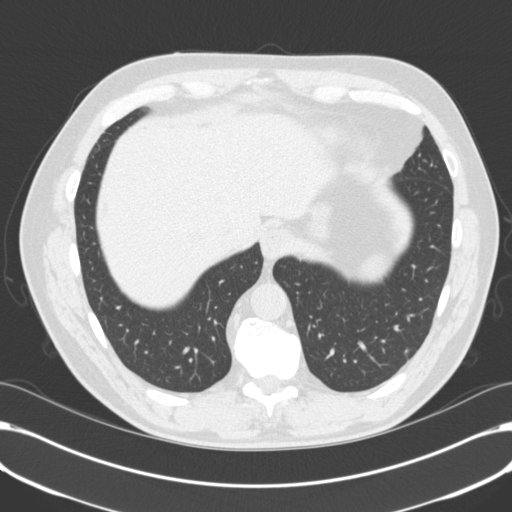
[im 23/125  vessel]
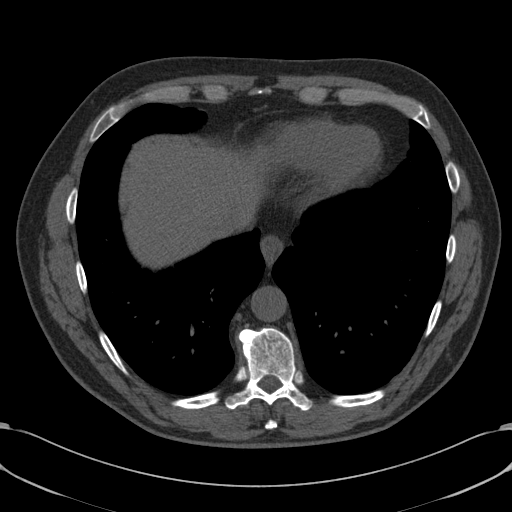
[im 34/125  vessel]
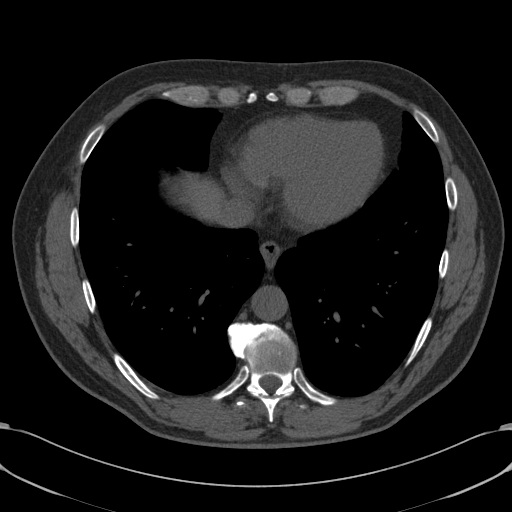
[im 46/125  vessel]
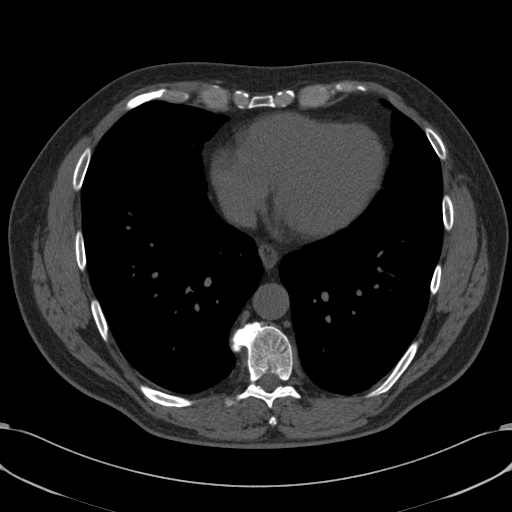
[im 57/125  vessel]
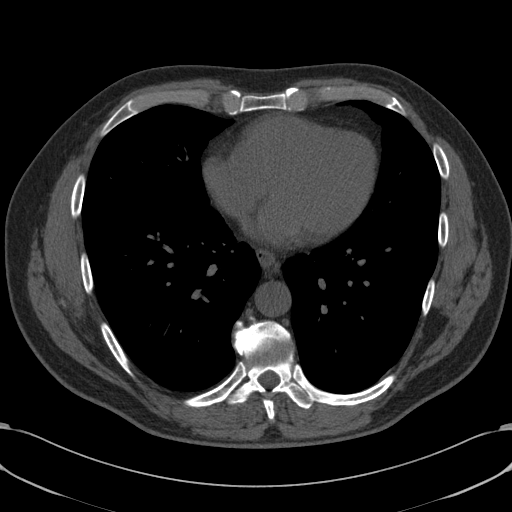
[im 57/125  lung]
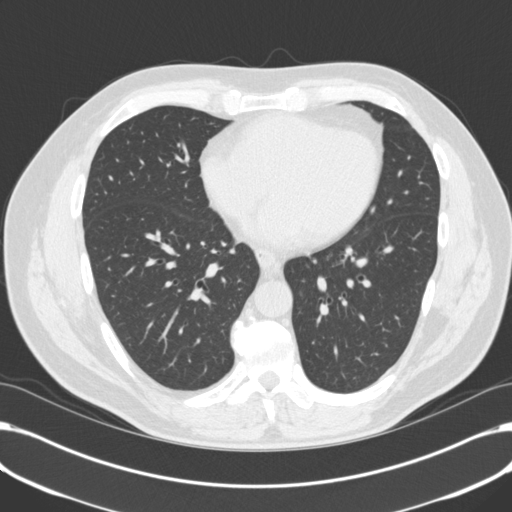
[im 68/125  vessel]
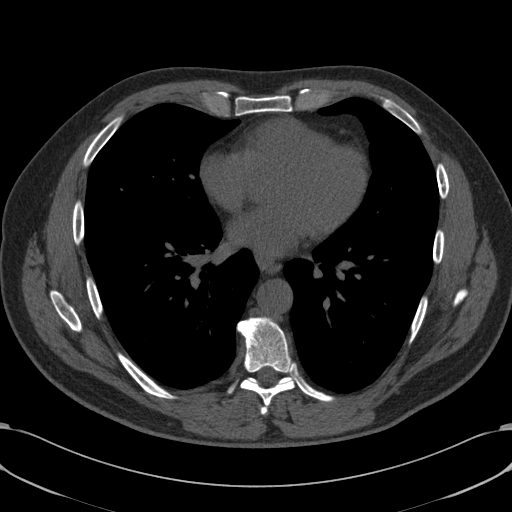
[im 79/125  vessel]
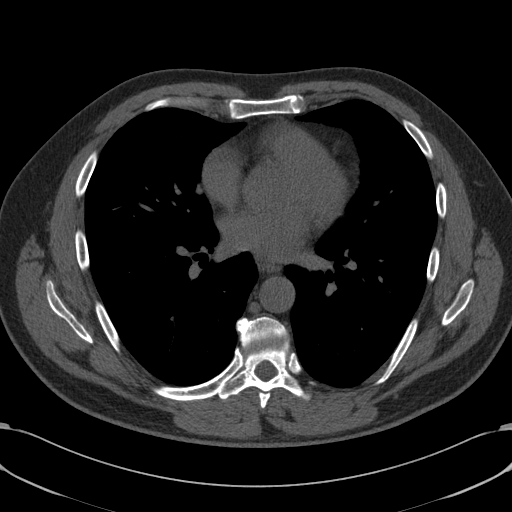
[im 91/125  vessel]
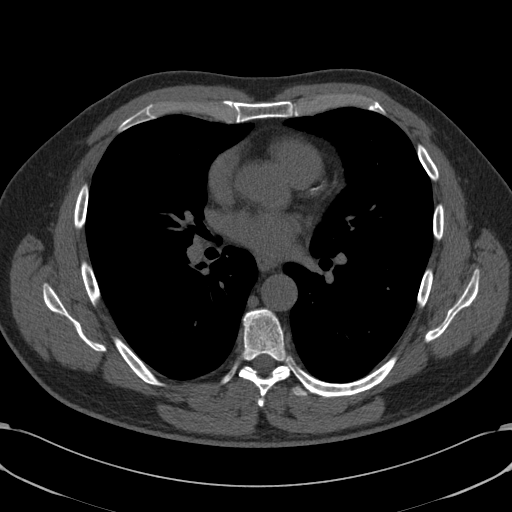
[im 102/125  vessel]
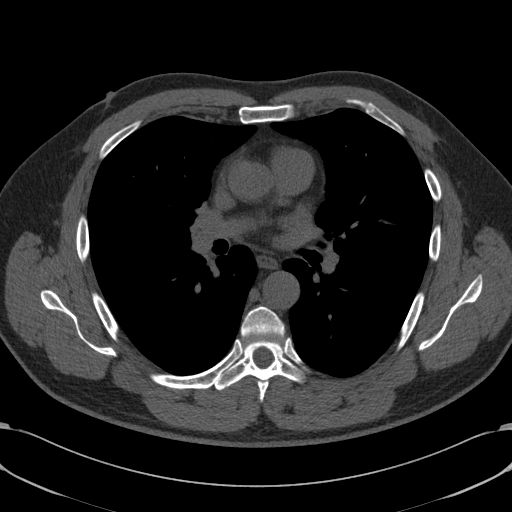
[im 102/125  lung]
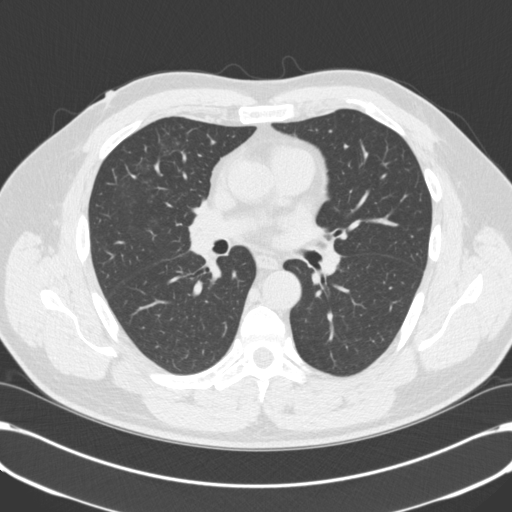
[im 113/125  vessel]
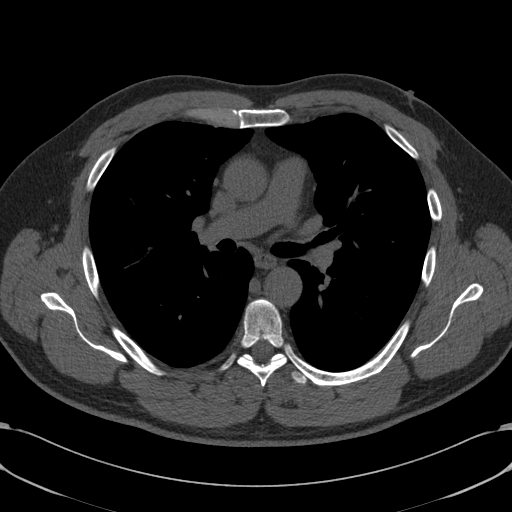

[10 of 20 positions shown; findings below may reference images not displayed]

FINDINGS: Visualized lungs are clear.  No suspicious pulmonary
nodules.

7 mm short-axis subcarinal node (series 3/image 2), within normal
limits.

Visualized soft tissues are unremarkable.

Degenerative changes of the visualized thoracic spine.
IMPRESSION: Visualized chest is unremarkable.

***END ADDENDUM*** SIGNED BY: MOLONGWANE, M.D.
Coronary Calcium Score:
FINDINGS: Normal coronary origins

Normal ascending arotic root

Normal Pericardium

Calcium Score 14.5 with foci only in the mid LAD
IMPRESSION: Coronary calcium score 14.5 54th percentile for age and sex matched
controls

## 2013-01-10 NOTE — Progress Notes (Signed)
Patient ID: Darrell Fuller, male   DOB: 06-03-1961, 52 y.o.   MRN: [id number] 52 yo referred by Dr Collins Scotland for dyspnea and chest pain. Positive family history of CAD. Horrible cholesterol with TC over 300 6 months agoa  Had foot surgery in December and somewhat of a slow recovery. No edema or DVT.  Has exertional dyspnea going up stairs.  Some central chest pressure with and without exercise No GI overtones. Palpitations 4-5x/week.  Rapid irregular beats. Separate issue than dyspnea and chest pain.  Had been on vivanase in past but no stimulants now. Chest pain is intermitant not sharp and not always exertional.  All these symptoms worse over the last 2 weeks. Does have some stress with a blended family of 6 children living at home and running [employer].No recent cardiac w/u or history.  ROS: Denies fever, malais, weight loss, blurry vision, decreased visual acuity, cough, sputum, SOB, hemoptysis, pleuritic pain, palpitaitons, heartburn, abdominal pain, melena, lower extremity edema, claudication, or rash.  All other systems reviewed and negative   General: Affect appropriate Healthy:  appears stated age HEENT: normal Neck supple with no adenopathy JVP normal no bruits no thyromegaly Lungs clear with no wheezing and good diaphragmatic motion Heart:  S1/S2 no murmur,rub, gallop or click PMI normal Abdomen: benighn, BS positve, no tenderness, no AAA no bruit.  No HSM or HJR Distal pulses intact with no bruits No edema Neuro non-focal Skin warm and dry No muscular weakness  Medications Current Outpatient Prescriptions  Medication Sig Dispense Refill  . aspirin EC 325 MG tablet Take 325 mg by mouth daily.      Marland Kitchen atorvastatin (LIPITOR) 40 MG tablet Take 1 tablet (40 mg total) by mouth daily.  90 tablet  3  . montelukast (SINGULAIR) 10 MG tablet Take 1 tablet (10 mg total) by mouth at bedtime.  90 tablet  3  . pantoprazole (PROTONIX) 40 MG tablet Take 1 tablet (40 mg total) by mouth daily.  90  tablet  3  . Testosterone (ANDROGEL PUMP) 20.25 MG/ACT (1.62%) GEL 2 pumps into the skin daily per Dr. Laverle Patter       No current facility-administered medications for this visit.    Allergies Pravastatin; Tramadol; and Simvastatin  Family History: Family History  Problem Relation Age of Onset  . Colon cancer Neg Hx   . Stomach cancer Neg Hx   . Heart disease Father     Social History: History   Social History  . Marital Status: Married    Spouse Name: N/A    Number of Children: 4  . Years of Education: N/A   Occupational History  . chef    Social History Main Topics  . Smoking status: Never Smoker   . Smokeless tobacco: Never Used  . Alcohol Use: 10.5 oz/week    21 drink(s) per week  . Drug Use: No  . Sexually Active: Not on file   Other Topics Concern  . Not on file   Social History Narrative  . No narrative on file    Electrocardiogram:  NSR normal ECG  07/12/12  Assessment and Plan

## 2013-01-10 NOTE — Progress Notes (Signed)
Patient ID: Darrell Fuller, male   DOB: 1960-11-25, 52 y.o.   MRN: [id number] 14 Day e-cardio monitor placed on patient.

## 2013-01-10 NOTE — Patient Instructions (Signed)
Your physician recommends that you schedule a follow-up appointment in: SEE [facility] AFTER TESTS  Your physician recommends that you continue on your current medications as directed. Please refer to the Current Medication list given to you today. Your physician has recommended that you wear an event monitor. Event monitors are medical devices that record the heart's electrical activity. Doctors most often [place] these monitors to diagnose arrhythmias. Arrhythmias are problems with the speed or rhythm of the heartbeat. The monitor is a small, portable device. You can wear one while you do your normal daily activities. This is usually used to diagnose what is causing palpitations/syncope (passing out). 14 DAY  Your physician has requested that you have a stress echocardiogram. For further information please visit [email or web address] Please follow instruction sheet as given.  CALCIUM  SCORE

## 2013-01-10 NOTE — Assessment & Plan Note (Signed)
Seems functional with normal exam Reviewed recent CXR done by Dr Kriste Basque and normal with NAD and no CE.  Will do echo as part of stress echo to assess RV and LV function

## 2013-01-10 NOTE — Assessment & Plan Note (Signed)
Atypical with normal ECG.  Family history of CAD and horrible lipid profile.  Calcium score to assess 5 year risk and f/u stress echo

## 2013-01-10 NOTE — Assessment & Plan Note (Signed)
Will try to track down LDL from Dr Collins Scotland. Continue statin.  May need crestor or vytorin if calcium score not 0

## 2013-01-19 ENCOUNTER — Ambulatory Visit (HOSPITAL_BASED_OUTPATIENT_CLINIC_OR_DEPARTMENT_OTHER): Payer: BC Managed Care – PPO

## 2013-01-19 ENCOUNTER — Encounter: Payer: Self-pay | Admitting: Cardiovascular Disease

## 2013-01-19 ENCOUNTER — Ambulatory Visit (HOSPITAL_COMMUNITY): Payer: BC Managed Care – PPO | Attending: Cardiovascular Disease | Admitting: Radiology

## 2013-01-19 DIAGNOSIS — R0602 Shortness of breath: Secondary | ICD-10-CM

## 2013-01-19 DIAGNOSIS — R079 Chest pain, unspecified: Secondary | ICD-10-CM

## 2013-01-19 DIAGNOSIS — R072 Precordial pain: Secondary | ICD-10-CM

## 2013-01-19 DIAGNOSIS — R0989 Other specified symptoms and signs involving the circulatory and respiratory systems: Secondary | ICD-10-CM

## 2013-01-19 NOTE — Progress Notes (Signed)
Stress Echocardiogram performed.  

## 2013-01-22 ENCOUNTER — Telehealth: Payer: Self-pay | Admitting: Cardiovascular Disease

## 2013-01-22 NOTE — Telephone Encounter (Signed)
Follow up    Return call back to nurse  

## 2013-01-22 NOTE — Telephone Encounter (Signed)
PT  AWARE OF STRESS ECHO RESULTS ./CY 

## 2013-01-26 ENCOUNTER — Ambulatory Visit: Payer: BC Managed Care – PPO | Admitting: Cardiovascular Disease

## 2013-02-09 ENCOUNTER — Ambulatory Visit (INDEPENDENT_AMBULATORY_CARE_PROVIDER_SITE_OTHER): Payer: BC Managed Care – PPO | Admitting: Cardiovascular Disease

## 2013-02-09 VITALS — BP 138/82 | HR 88 | Ht 66.0 in | Wt 180.4 lb

## 2013-02-09 DIAGNOSIS — R079 Chest pain, unspecified: Secondary | ICD-10-CM

## 2013-02-09 DIAGNOSIS — F411 Generalized anxiety disorder: Secondary | ICD-10-CM

## 2013-02-09 DIAGNOSIS — E785 Hyperlipidemia, unspecified: Secondary | ICD-10-CM

## 2013-02-09 MED ORDER — PROPRANOLOL HCL 10 MG PO TABS: 10.0000 mg | ORAL_TABLET | ORAL | Status: DC | PRN

## 2013-02-09 NOTE — Patient Instructions (Addendum)
Your physician wants you to follow-up in:   6 MONTHS WITH DR Haywood Filler will receive a reminder letter in the mail two months in advance. If you don't receive a letter, please call our office to schedule the follow-up appointment. Your physician has recommended you make the following change in your medication: INDERAL 10 MG  AS NEEDED

## 2013-02-09 NOTE — Progress Notes (Signed)
Patient ID: Darrell Fuller, male   DOB: Jan 20, 1961, 52 y.o.   MRN: [id number] 52 yo referred by Dr Collins Scotland for dyspnea and chest pain. Positive family history of CAD. Horrible cholesterol with TC over 300 6 months agoa Had foot surgery in December and somewhat of a slow recovery. No edema or DVT. Has exertional dyspnea going up stairs. Some central chest pressure with and without exercise No GI overtones. Palpitations 4-5x/week. Rapid irregular beats. Separate issue than dyspnea and chest pain. Had been on vivanase in past but no stimulants now. Chest pain is intermitant not sharp and not always exertional. All these symptoms worse over the last 2 weeks. Does have some stress with a blended family of 6 children living at home and running [employer].No recent cardiac w/u or history.  F/U calcium score was 0 Echo was normla Monitor NSR but he did not activate at time of symptoms  01/10/13-01/23/13  ROS: Denies fever, malais, weight loss, blurry vision, decreased visual acuity, cough, sputum, SOB, hemoptysis, pleuritic pain, palpitaitons, heartburn, abdominal pain, melena, lower extremity edema, claudication, or rash.  All other systems reviewed and negative  General: Affect appropriate Healthy:  appears stated age HEENT: normal Neck supple with no adenopathy JVP normal no bruits no thyromegaly Lungs clear with no wheezing and good diaphragmatic motion Heart:  S1/S2 no murmur, no rub, gallop or click PMI normal Abdomen: benighn, BS positve, no tenderness, no AAA no bruit.  No HSM or HJR Distal pulses intact with no bruits No edema Neuro non-focal Skin warm and dry No muscular weakness   Current Outpatient Prescriptions  Medication Sig Dispense Refill  . aspirin EC 325 MG tablet Take 325 mg by mouth daily.      Marland Kitchen atorvastatin (LIPITOR) 40 MG tablet Take 1 tablet (40 mg total) by mouth daily.  90 tablet  3  . montelukast (SINGULAIR) 10 MG tablet Take 1 tablet (10 mg total) by mouth at bedtime.   90 tablet  3  . pantoprazole (PROTONIX) 40 MG tablet Take 1 tablet (40 mg total) by mouth daily.  90 tablet  3  . Testosterone (ANDROGEL PUMP) 20.25 MG/ACT (1.62%) GEL 2 pumps into the skin daily per Dr. Laverle Patter       No current facility-administered medications for this visit.    Allergies  Pravastatin; Tramadol; and Simvastatin  Electrocardiogram:  Assessment and Plan

## 2013-02-09 NOTE — Assessment & Plan Note (Signed)
Cholesterol is at goal.  Continue current dose of statin and diet Rx.  No myalgias or side effects.  F/U  LFT's in 6 months. Lab Results  Component Value Date   LDLCALC 111* 07/31/2009

## 2013-02-09 NOTE — Assessment & Plan Note (Signed)
Likely related to symptoms Palpitations at night still  Since he did not activate monitor unitl after symptoms passed may be missing something Will call in PRN Inderal and f/u in 6 months

## 2013-02-09 NOTE — Assessment & Plan Note (Signed)
Normal stress echo and calcium score of 0  Observe for now

## 2013-03-30 ENCOUNTER — Other Ambulatory Visit: Payer: Self-pay | Admitting: *Deleted

## 2013-03-30 MED ORDER — PROPRANOLOL HCL 10 MG PO TABS: 10.0000 mg | ORAL_TABLET | ORAL | Status: DC | PRN

## 2013-04-02 ENCOUNTER — Other Ambulatory Visit: Payer: Self-pay | Admitting: *Deleted

## 2013-04-02 MED ORDER — PROPRANOLOL HCL 10 MG PO TABS: 10.0000 mg | ORAL_TABLET | ORAL | Status: DC | PRN

## 2013-09-17 ENCOUNTER — Encounter: Payer: Self-pay | Admitting: Cardiovascular Disease

## 2013-09-17 ENCOUNTER — Ambulatory Visit (INDEPENDENT_AMBULATORY_CARE_PROVIDER_SITE_OTHER): Payer: BC Managed Care – PPO | Admitting: Cardiovascular Disease

## 2013-09-17 VITALS — BP 140/82 | HR 86 | Ht 66.0 in | Wt 190.0 lb

## 2013-09-17 DIAGNOSIS — R079 Chest pain, unspecified: Secondary | ICD-10-CM

## 2013-09-17 DIAGNOSIS — R030 Elevated blood-pressure reading, without diagnosis of hypertension: Secondary | ICD-10-CM

## 2013-09-17 DIAGNOSIS — IMO0001 Reserved for inherently not codable concepts without codable children: Secondary | ICD-10-CM

## 2013-09-17 DIAGNOSIS — E785 Hyperlipidemia, unspecified: Secondary | ICD-10-CM

## 2013-09-17 MED ORDER — ATORVASTATIN CALCIUM 40 MG PO TABS: 40.0000 mg | ORAL_TABLET | Freq: Every day | ORAL | Status: DC

## 2013-09-17 NOTE — Assessment & Plan Note (Signed)
Cholesterol is at goal.  Continue current dose of statin and diet Rx.  No myalgias or side effects.  F/U  LFT's in 6 months. Lab Results  Component Value Date   LDLCALC 111* 07/31/2009  Labs with Dr Kriste BasqueNadel next month

## 2013-09-17 NOTE — Progress Notes (Signed)
Patient ID: Darrell Fuller, male   DOB: 05/05/1961, 53 y.o.   MRN: [id number] 53 yo referred by Dr Collins ScotlandSpear in  May 2014 for  dyspnea and chest pain. Positive family history of CAD. Horrible cholesterol with TC over 300 6 months agoa Had foot surgery in December and somewhat of a slow recovery. No edema or DVT. Has exertional dyspnea going up stairs. Some central chest pressure with and without exercise No GI overtones. Palpitations 4-5x/week. Rapid irregular beats. Separate issue than dyspnea and chest pain. Had been on vivanase in past but no stimulants now. Chest pain is intermitant not sharp and not always exertional. All these symptoms worse over the last 2 weeks. Does have some stress with a blended family of 6 children living at home and running [employer].No recent cardiac w/u or history.  01/19/13 stress echo was normal Event monitor  NSR no arrythmia   Less palpitations Seeing Kriste Basqueadel for lab work.  Sedentary with some salt in diet  BP high in office      ROS: Denies fever, malais, weight loss, blurry vision, decreased visual acuity, cough, sputum, SOB, hemoptysis, pleuritic pain, palpitaitons, heartburn, abdominal pain, melena, lower extremity edema, claudication, or rash.  All other systems reviewed and negative  General: Affect appropriate Healthy:  appears stated age HEENT: normal Neck supple with no adenopathy JVP normal no bruits no thyromegaly Lungs clear with no wheezing and good diaphragmatic motion Heart:  S1/S2 no murmur, no rub, gallop or click PMI normal Abdomen: benighn, BS positve, no tenderness, no AAA no bruit.  No HSM or HJR Distal pulses intact with no bruits No edema Neuro non-focal Skin warm and dry No muscular weakness   Current Outpatient Prescriptions  Medication Sig Dispense Refill  . aspirin EC 325 MG tablet Take 325 mg by mouth daily.      Marland Kitchen. atorvastatin (LIPITOR) 40 MG tablet Take 1 tablet (40 mg total) by mouth daily.  90 tablet  3  . montelukast  (SINGULAIR) 10 MG tablet Take 1 tablet (10 mg total) by mouth at bedtime.  90 tablet  3  . pantoprazole (PROTONIX) 40 MG tablet Take 1 tablet (40 mg total) by mouth daily.  90 tablet  3  . propranolol (INDERAL) 10 MG tablet Take 1 tablet (10 mg total) by mouth as needed.  90 tablet  3  . Testosterone (ANDROGEL PUMP) 20.25 MG/ACT (1.62%) GEL 2 pumps into the skin daily per Dr. Laverle PatterBorden       No current facility-administered medications for this visit.    Allergies  Pravastatin; Tramadol; and Simvastatin  Electrocardiogram:  11/13  SR rate 72 normal ECG   Assessment and Plan

## 2013-09-17 NOTE — Assessment & Plan Note (Signed)
Resolved stress echo normal 5/14  Stable

## 2013-09-17 NOTE — Assessment & Plan Note (Signed)
Discussed diet and exercise Will buy BP cuff and record pressures at home  F/U with me in 3 months to review

## 2013-09-17 NOTE — Patient Instructions (Addendum)
Your physician recommends that you schedule a follow-up appointment in: 3 months with Dr. Eden EmmsNishan to recheck BP    Your physician recommends that you continue on your current medications as directed. Please refer to the Current Medication list given to you today.

## 2013-10-10 ENCOUNTER — Other Ambulatory Visit: Payer: Self-pay | Admitting: Pulmonary Disease

## 2014-01-23 ENCOUNTER — Telehealth: Payer: Self-pay | Admitting: Pulmonary Disease

## 2014-01-24 NOTE — Telephone Encounter (Signed)
I called spoke with spouse. I gave her # to [facility]. Nothing further needed

## 2014-02-08 ENCOUNTER — Telehealth: Payer: Self-pay | Admitting: Cardiovascular Disease

## 2014-02-08 NOTE — Telephone Encounter (Signed)
UNABLE TO LEAVE MESSAGE   MAIL BOX  FULL  WILL TRY LATER ./CY 

## 2014-02-08 NOTE — Telephone Encounter (Signed)
New Message:  Pt's wife called to schedule an appt... She states her husband has swelling on his left temple on his head... She states he has an appt on Monday with his PcP... However, she wanted him to see Dr. Eden EmmsNishan and she wants to make him and his nurse aware.. She is requesting a call back from [place].

## 2014-02-08 NOTE — Telephone Encounter (Signed)
SPOKE  WITH PT'S  WIFE   PT  HAS  HAD  A  SWOLLEN LEFT  TEMPLE  FOR  APPROX   3 MONTHS    SWELLING  ON OCC CAN BE  WORSE ON  SOME  DAYS    BUT NOT  ALL DAYS   AND ALSO  HAS  PAIN    ON   OCCASION BUT NOT  EVERY DAY  WIFE  HAD  HEARD  TEMPLE  SWELLING  COULD  BE  CAUSED  BY   HIGH   B/P  INFORMED  WIFE  TO CHECK  B/P DAILY AT  SAME TIME  AND  KEEP  LOG  IF  NOTES  CONSISTENTLY HIGH  B/P 'S  TO  CALL OFFICE   HAS  APPT ON  MON WITH PMD   TO EVAL   SWELLING  AS  WELL   WILL FORWARD  TO DR Eden EmmsNISHAN  FOR  REVIEW .Zack Seal/CY

## 2014-03-04 ENCOUNTER — Encounter: Payer: Self-pay | Admitting: Cardiovascular Disease

## 2014-04-11 ENCOUNTER — Encounter: Payer: Self-pay | Admitting: Cardiovascular Disease

## 2014-04-11 ENCOUNTER — Ambulatory Visit (INDEPENDENT_AMBULATORY_CARE_PROVIDER_SITE_OTHER): Payer: BC Managed Care – PPO | Admitting: Cardiovascular Disease

## 2014-04-11 VITALS — BP 135/70 | HR 77 | Ht 66.0 in | Wt 185.0 lb

## 2014-04-11 DIAGNOSIS — R079 Chest pain, unspecified: Secondary | ICD-10-CM

## 2014-04-11 DIAGNOSIS — R030 Elevated blood-pressure reading, without diagnosis of hypertension: Secondary | ICD-10-CM

## 2014-04-11 DIAGNOSIS — IMO0001 Reserved for inherently not codable concepts without codable children: Secondary | ICD-10-CM

## 2014-04-11 DIAGNOSIS — E291 Testicular hypofunction: Secondary | ICD-10-CM

## 2014-04-11 DIAGNOSIS — K219 Gastro-esophageal reflux disease without esophagitis: Secondary | ICD-10-CM

## 2014-04-11 MED ORDER — NEBIVOLOL HCL 5 MG PO TABS: 5.0000 mg | ORAL_TABLET | Freq: Every day | ORAL | Status: DC

## 2014-04-11 NOTE — Addendum Note (Signed)
Addended by: Recardo EvangelistWILLIAMSON, Emiel Kielty L on: 04/11/2014 11:04 AM   Modules accepted: Orders

## 2014-04-11 NOTE — Progress Notes (Signed)
Patient ID: Darrell Fuller, male   DOB: 10/02/1960, 53 y.o.   MRN: [id number] 53 yo referred by Dr Collins ScotlandSpear in May 2014 for dyspnea and chest pain. Positive family history of CAD. Horrible cholesterol with TC over 300 6 months agoa Had foot surgery in December and somewhat of a slow recovery. No edema or DVT. Has exertional dyspnea going up stairs. Some central chest pressure with and without exercise No GI overtones. Palpitations 4-5x/week. Rapid irregular beats. Separate issue than dyspnea and chest pain. Had been on vivanase in past but no stimulants now. Chest pain is intermitant not sharp and not always exertional. All these symptoms worse over the last 2 weeks. Does have some stress with a blended family of 6 children living at home and running [employer].   01/19/13 stress echo was normal  Event monitor NSR no arrythmia   Less palpitations . 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  All other systems reviewed and negative  General: Affect appropriate Healthy:  appears stated age HEENT: normal Neck supple with no adenopathy JVP normal no bruits no thyromegaly Lungs clear with no wheezing and good diaphragmatic motion Heart:  S1/S2 no murmur, no rub, gallop or click PMI normal Abdomen: benighn, BS positve, no tenderness, no AAA no bruit.  No HSM or HJR Distal pulses intact with no bruits No edema Neuro non-focal Skin warm and dry No muscular weakness   Current Outpatient Prescriptions  Medication Sig Dispense Refill  . aspirin EC 325 MG tablet Take 325 mg by  mouth daily.      Marland Kitchen. atorvastatin (LIPITOR) 40 MG tablet Take 1 tablet (40 mg total) by mouth daily.  90 tablet  3  . montelukast (SINGULAIR) 10 MG tablet Take 1 tablet (10 mg total) by mouth at bedtime.  90 tablet  3  . pantoprazole (PROTONIX) 40 MG tablet Take 1 tablet (40 mg total) by mouth daily.  90 tablet  3  . propranolol (INDERAL) 10 MG tablet Take 1 tablet (10 mg total) by mouth as needed.  90 tablet  3  . Testosterone (ANDROGEL PUMP) 20.25 MG/ACT (1.62%) GEL 2 pumps into the skin daily per Dr. Laverle PatterBorden       No current facility-administered medications for this visit.    Allergies  Pravastatin; Tramadol; and Simvastatin  Electrocardiogram:  NSR normnal ECG no LVH rate 77  Assessment and Plan

## 2014-04-11 NOTE — Assessment & Plan Note (Signed)
Continue supplement F/U Dr Cyndia BentBadger for blood level

## 2014-04-11 NOTE — Assessment & Plan Note (Signed)
Start bystolic 5 mg  Daily Continue home readings F/U next available

## 2014-04-11 NOTE — Assessment & Plan Note (Signed)
Resolved with normal stress echo

## 2014-04-11 NOTE — Patient Instructions (Addendum)
Please start Bystolic 5 mg a day.  Continue all other medications as listed.  Follow up with Dr Eden EmmsNishan as scheduled.

## 2014-04-11 NOTE — Assessment & Plan Note (Signed)
Does take PRN protonix  Needs weight loss and low carb diet

## 2014-04-12 ENCOUNTER — Ambulatory Visit: Payer: BC Managed Care – PPO | Admitting: Cardiovascular Disease

## 2014-05-24 ENCOUNTER — Encounter: Payer: Self-pay | Admitting: Cardiovascular Disease

## 2014-05-24 ENCOUNTER — Ambulatory Visit (INDEPENDENT_AMBULATORY_CARE_PROVIDER_SITE_OTHER): Payer: BC Managed Care – PPO | Admitting: Cardiovascular Disease

## 2014-05-24 VITALS — BP 118/70 | HR 71 | Ht 66.0 in | Wt 184.8 lb

## 2014-05-24 DIAGNOSIS — IMO0001 Reserved for inherently not codable concepts without codable children: Secondary | ICD-10-CM

## 2014-05-24 DIAGNOSIS — R030 Elevated blood-pressure reading, without diagnosis of hypertension: Secondary | ICD-10-CM

## 2014-05-24 DIAGNOSIS — J209 Acute bronchitis, unspecified: Secondary | ICD-10-CM

## 2014-05-24 DIAGNOSIS — E785 Hyperlipidemia, unspecified: Secondary | ICD-10-CM

## 2014-05-24 MED ORDER — NEBIVOLOL HCL 5 MG PO TABS: 5.0000 mg | ORAL_TABLET | Freq: Every day | ORAL | Status: DC

## 2014-05-24 NOTE — Patient Instructions (Signed)
Your physician recommends that you continue on your current medications as directed. Please refer to the Current Medication list given to you today.  Your physician wants you to follow-up in: 1 year with Dr. Nishan. You will receive a reminder letter in the mail two months in advance. If you don't receive a letter, please call our office to schedule the follow-up appointment.  

## 2014-05-24 NOTE — Assessment & Plan Note (Signed)
Cholesterol is at goal.  Continue current dose of statin and diet Rx.  No myalgias or side effects.  F/U  LFT's in 6 months. Lab Results  Component Value Date   LDLCALC 111* 07/31/2009   Labs with primary

## 2014-05-24 NOTE — Assessment & Plan Note (Signed)
Stable Fall allergies not bad  bystolic has not exacerbated

## 2014-05-24 NOTE — Assessment & Plan Note (Signed)
Given tremor occasional anxiety started bystolic for BP Reviewed home readings and they are good.  Stressed compliance 90 day supply called in  Improved continue current Rx

## 2014-05-24 NOTE — Progress Notes (Signed)
Patient ID: Darrell Fuller, male   DOB: 09/30/1960, 53 y.o.   MRN: [id number] 53 yo referred by Dr Collins ScotlandSpear in May 2014 for dyspnea and chest pain. Positive family history of CAD. Horrible cholesterol with TC over 300 6 months agoa Had foot surgery in December and somewhat of a slow recovery. No edema or DVT. Has exertional dyspnea going up stairs. Some central chest pressure with and without exercise No GI overtones. Palpitations 4-5x/week. Rapid irregular beats. Separate issue than dyspnea and chest pain. Had been on vivanase in past but no stimulants now. Chest pain is intermitant not sharp and not always exertional. All these symptoms worse over the last 2 weeks. Does have some stress with a blended family of 6 children living at home and running [employer].  01/19/13 stress echo was normal  Event monitor NSR no arrythmia  Less palpitations . 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  All other systems reviewed and negative  General: Affect appropriate Healthy:  appears stated age HEENT: normal Neck supple with no adenopathy JVP normal no bruits no thyromegaly Lungs clear with no wheezing and good diaphragmatic motion Heart:  S1/S2 no murmur, no rub, gallop or click PMI normal Abdomen: benighn, BS positve, no tenderness, no AAA no bruit.  No HSM or HJR Distal pulses intact with no bruits No edema Neuro non-focal Skin warm and dry No muscular weakness   Current Outpatient Prescriptions  Medication Sig Dispense Refill  . aspirin EC 325 MG tablet Take 325 mg by mouth daily.      Marland Kitchen.  atorvastatin (LIPITOR) 40 MG tablet Take 40 mg by mouth daily.      . montelukast (SINGULAIR) 10 MG tablet Take 1 tablet (10 mg total) by mouth at bedtime.  90 tablet  3  . nebivolol (BYSTOLIC) 5 MG tablet Take 1 tablet (5 mg total) by mouth daily.  30 tablet  6  . pantoprazole (PROTONIX) 40 MG tablet Take 1 tablet (40 mg total) by mouth daily.  90 tablet  3  . propranolol (INDERAL) 10 MG tablet Take 1 tablet (10 mg total) by mouth as needed.  90 tablet  3  . Testosterone (ANDROGEL PUMP) 20.25 MG/ACT (1.62%) GEL 2 pumps into the skin daily per Dr. Laverle PatterBorden       No current facility-administered medications for this visit.    Allergies  Morphine and related; Pravastatin; Tramadol; and Simvastatin  Electrocardiogram:  SR rate 77 PAC  No acute changes   Assessment and Plan

## 2014-05-29 ENCOUNTER — Other Ambulatory Visit: Payer: Self-pay

## 2014-05-29 DIAGNOSIS — IMO0001 Reserved for inherently not codable concepts without codable children: Secondary | ICD-10-CM

## 2014-05-29 DIAGNOSIS — R030 Elevated blood-pressure reading, without diagnosis of hypertension: Principal | ICD-10-CM

## 2014-05-29 MED ORDER — NEBIVOLOL HCL 5 MG PO TABS: 5.0000 mg | ORAL_TABLET | Freq: Every day | ORAL | Status: DC

## 2014-10-11 ENCOUNTER — Other Ambulatory Visit: Payer: Self-pay | Admitting: Cardiovascular Disease

## 2017-06-29 ENCOUNTER — Ambulatory Visit: Payer: Self-pay | Admitting: Physician Assistant

## 2018-02-06 ENCOUNTER — Telehealth: Payer: Self-pay | Admitting: Cardiovascular Disease

## 2018-02-06 NOTE — Telephone Encounter (Signed)
[facility] 02/06/18, Appt 02/10/18 @ 11:15AM. [place]

## 2018-02-10 ENCOUNTER — Ambulatory Visit (INDEPENDENT_AMBULATORY_CARE_PROVIDER_SITE_OTHER): Payer: BLUE CROSS/BLUE SHIELD | Admitting: Cardiovascular Disease

## 2018-02-10 ENCOUNTER — Encounter: Payer: Self-pay | Admitting: Cardiovascular Disease

## 2018-02-10 DIAGNOSIS — R002 Palpitations: Secondary | ICD-10-CM | POA: Diagnosis not present

## 2018-02-10 DIAGNOSIS — R0600 Dyspnea, unspecified: Secondary | ICD-10-CM

## 2018-02-10 DIAGNOSIS — E7800 Pure hypercholesterolemia, unspecified: Secondary | ICD-10-CM | POA: Diagnosis not present

## 2018-02-10 NOTE — Progress Notes (Signed)
02/10/2018 Darrell Fuller   11/21/1960  [id number]  Primary Physician Eartha InchBadger, Michael C, MD Primary Cardiologist: Runell GessJonathan J Jacaria Colburn MD Nicholes CalamityFACP, FACC, FAHA, [place]  HPI:  Darrell Fuller is a 57 y.o. mildly overweight married Caucasian male father of 6 children for whom are living in his house who was referred by Dr. Cyndia BentBadger for cardiovascular evaluation because of palpitations and dyspnea.  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  He did see Dr. Eden EmmsNishan back in 2014 who performed 2D echo that was normal, stress test that was normal and the coronary CTA that was normal as well.  He does have a history of coronary artery disease with a father who had myocardial infarctions in his 7130s and died at age 58 of this.  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  He is never had a heart attack or stroke.  He denies chest pain but does get dyspnea on exertion especially walking up an incline.  Is complaining more recently of early morning palpitations.   Current Meds  Medication Sig  . esomeprazole (NEXIUM) 40 MG packet Take 40 mg by mouth daily before breakfast.     Allergies  Allergen Reactions  . Hydrocodone-Acetaminophen Other (See Comments)    Other reaction(s): Other Skin crawling Skin crawling   . Morphine And Related Hives  . Pravastatin Itching  . Tramadol Itching    Cannot sleep  . Simvastatin Itching and Rash    Social History   Socioeconomic History  . Marital status: Married    Spouse name: Not on file  . Number of children: 4  . Years of education: Not on file  . Highest education level: Not on file  Occupational History  . Occupation: Designer, fashion/clothingchef    Employer: [employer]  Social Needs  . Financial resource strain: Not on file  . Food insecurity:    Worry: Not on file    Inability: Not on file  . Transportation needs:    Medical: Not on file    Non-medical: Not on file  Tobacco Use  . Smoking status: Never Smoker  . Smokeless tobacco:  Never Used  Substance and Sexual Activity  . Alcohol use: Yes    Alcohol/week: 12.6 oz    Types: 21 drink(s) per week  . Drug use: No  . Sexual activity: Not on file  Lifestyle  . Physical activity:    Days per week: Not on file    Minutes per session: Not on file  . Stress: Not on file  Relationships  . Social connections:    Talks on phone: Not on file    Gets together: Not on file    Attends religious service: Not on file    Active member of club or organization: Not on file    Attends meetings of clubs or organizations: Not on file    Relationship status: Not on file  . Intimate partner violence:    Fear of current or ex partner: Not on file    Emotionally abused: Not on file    Physically abused: Not on file    Forced sexual activity: Not on file  Other Topics Concern  . Not on file  Social History Narrative  . Not on file     Review of Systems: General: negative for chills, fever, night sweats or weight changes.  Cardiovascular: negative for chest pain, dyspnea on exertion, edema, orthopnea, palpitations, paroxysmal nocturnal dyspnea or shortness of  breath Dermatological: negative for rash Respiratory: negative for cough or wheezing Urologic: negative for hematuria Abdominal: negative for nausea, vomiting, diarrhea, bright red blood per rectum, melena, or hematemesis Neurologic: negative for visual changes, syncope, or dizziness All other systems reviewed and are otherwise negative except as noted above.    Blood pressure 111/72, pulse 72, height 5\' 6"  (1.676 m), weight 190 lb (86.2 kg).  General appearance: alert and no distress Neck: no adenopathy, no carotid bruit, no JVD, supple, symmetrical, trachea midline and thyroid not enlarged, symmetric, no tenderness/mass/nodules Lungs: clear to auscultation bilaterally Heart: regular rate and rhythm, S1, S2 normal, no murmur, click, rub or gallop Extremities: extremities normal, atraumatic, no cyanosis or  edema Pulses: 2+ and symmetric Skin: Skin color, texture, turgor normal. No rashes or lesions Neurologic: Alert and oriented X 3, normal strength and tone. Normal symmetric reflexes. Normal coordination and gait  EKG not performed today  ASSESSMENT AND PLAN:   Hyperlipidemia History of hyperlipidemia most likely familial.  He is statin intolerant.  His most recent lipid profile performed 01/18/2018 revealed total cholesterol 261, LDL 191 we will explore the possibility of starting a PCSK9 monoclonal such as Repatha.  Palpitations History of palpitations principally in the mornings.  I have advised him to eliminate caffeine from his diet.  We will check thyroid function test and a 30-day event monitor.  Dyspnea Patient planes of dyspnea especially walking up an incline.  For years since his last echo and functional study.  I am going to repeat a 2D echo and exercise Myoview stress test.      Runell Gess MD [facility], [facility] 02/10/2018 11:50 AM

## 2018-02-10 NOTE — Assessment & Plan Note (Signed)
History of hyperlipidemia most likely familial.  He is statin intolerant.  His most recent lipid profile performed 01/18/2018 revealed total cholesterol 261, LDL 191 we will explore the possibility of starting a PCSK9 monoclonal such as Repatha.

## 2018-02-10 NOTE — Assessment & Plan Note (Signed)
Patient planes of dyspnea especially walking up an incline.  For years since his last echo and functional study.  I am going to repeat a 2D echo and exercise Myoview stress test.

## 2018-02-10 NOTE — Assessment & Plan Note (Signed)
History of palpitations principally in the mornings.  I have advised him to eliminate caffeine from his diet.  We will check thyroid function test and a 30-day event monitor.

## 2018-02-10 NOTE — Patient Instructions (Signed)
Medication Instructions: Your physician recommends that you continue on your current medications as directed. Please refer to the Current Medication list given to you today.  Labwork: Your physician recommends that you return for lab work today--TSH, Free T4   Testing/Procedures: Your physician has requested that you have an echocardiogram. Echocardiography is a painless test that uses sound waves to create images of your heart. It provides your doctor with information about the size and shape of your heart and how well your heart's chambers and valves are working. This procedure takes approximately one hour. There are no restrictions for this procedure.  Your physician has requested that you have an exercise stress myoview. For further information please visit [email or web address]. Please follow instruction sheet, as given.  Your physician has recommended that you wear a 30 day event monitor. Event monitors are medical devices that record the heart's electrical activity. Doctors most often us these monitors to diagnose arrhythmias. Arrhythmias are problems with the speed or rhythm of the heartbeat. The monitor is a small, portable device. You can wear one while you do your normal daily activities. This is usually used to diagnose what is causing palpitations/syncope (passing out).  Follow-Up: Your physician recommends that you schedule a follow-up appointment after testing with Dr. Allyson SabalBerry.  If you need a refill on your cardiac medications before your next appointment, please call your pharmacy.

## 2018-02-11 LAB — HEPATIC FUNCTION PANEL
ALT: 37 IU/L (ref 0–44)
AST: 26 IU/L (ref 0–40)
Albumin: 4.3 g/dL (ref 3.5–5.5)
Alkaline Phosphatase: 77 IU/L (ref 39–117)
BILIRUBIN TOTAL: 0.4 mg/dL (ref 0.0–1.2)
Bilirubin, Direct: 0.11 mg/dL (ref 0.00–0.40)
Total Protein: 7 g/dL (ref 6.0–8.5)

## 2018-02-11 LAB — T4, FREE: Free T4: 1.03 ng/dL (ref 0.82–1.77)

## 2018-02-11 LAB — TSH: TSH: 1.17 u[IU]/mL (ref 0.450–4.500)

## 2018-02-11 LAB — LIPID PANEL
CHOL/HDL RATIO: 6.5 ratio — AB (ref 0.0–5.0)
Cholesterol, Total: 278 mg/dL — ABNORMAL HIGH (ref 100–199)
HDL: 43 mg/dL (ref >39)
LDL Calculated: 198 mg/dL — ABNORMAL HIGH (ref 0–99)
Triglycerides: 186 mg/dL — ABNORMAL HIGH (ref 0–149)
VLDL Cholesterol Cal: 37 mg/dL (ref 5–40)

## 2018-02-13 ENCOUNTER — Encounter: Payer: Self-pay | Admitting: Cardiovascular Disease

## 2018-02-17 ENCOUNTER — Telehealth: Payer: Self-pay | Admitting: Pharmacist

## 2018-02-17 MED ORDER — EVOLOCUMAB 140 MG/ML ~~LOC~~ SOAJ: 140.0000 mg | SUBCUTANEOUS | 11 refills | Status: DC

## 2018-02-17 NOTE — Telephone Encounter (Signed)
LMOM; Repatha pre-approved by insurance. Copay card available. [facility]

## 2018-02-21 ENCOUNTER — Other Ambulatory Visit: Payer: Self-pay | Admitting: Pharmacist

## 2018-02-21 MED ORDER — ALIROCUMAB 150 MG/ML ~~LOC~~ SOPN: 150.0000 mg | PEN_INJECTOR | SUBCUTANEOUS | 11 refills | Status: DC

## 2018-03-02 ENCOUNTER — Other Ambulatory Visit: Payer: Self-pay | Admitting: Pharmacist

## 2018-03-02 ENCOUNTER — Telehealth (HOSPITAL_COMMUNITY): Payer: Self-pay

## 2018-03-02 MED ORDER — EVOLOCUMAB 140 MG/ML ~~LOC~~ SOAJ: 140.0000 mg | SUBCUTANEOUS | 11 refills | Status: DC

## 2018-03-02 MED ORDER — ALIROCUMAB 150 MG/ML ~~LOC~~ SOPN: 150.0000 mg | PEN_INJECTOR | SUBCUTANEOUS | 11 refills | Status: DC

## 2018-03-02 NOTE — Telephone Encounter (Signed)
Encounter complete. 

## 2018-03-03 ENCOUNTER — Telehealth (HOSPITAL_COMMUNITY): Payer: Self-pay

## 2018-03-03 NOTE — Telephone Encounter (Signed)
Encounter complete. 

## 2018-03-06 ENCOUNTER — Ambulatory Visit (INDEPENDENT_AMBULATORY_CARE_PROVIDER_SITE_OTHER): Payer: BLUE CROSS/BLUE SHIELD

## 2018-03-06 ENCOUNTER — Other Ambulatory Visit: Payer: Self-pay

## 2018-03-06 ENCOUNTER — Ambulatory Visit (HOSPITAL_BASED_OUTPATIENT_CLINIC_OR_DEPARTMENT_OTHER): Payer: BLUE CROSS/BLUE SHIELD

## 2018-03-06 DIAGNOSIS — R0600 Dyspnea, unspecified: Secondary | ICD-10-CM

## 2018-03-06 DIAGNOSIS — R002 Palpitations: Secondary | ICD-10-CM

## 2018-03-07 ENCOUNTER — Ambulatory Visit (HOSPITAL_COMMUNITY)
Admission: RE | Admit: 2018-03-07 | Discharge: 2018-03-07 | Disposition: A | Discharge status: Home / Self Care | Payer: BLUE CROSS/BLUE SHIELD | Source: Ambulatory Visit | Attending: Cardiovascular Disease | Admitting: Cardiovascular Disease

## 2018-03-07 DIAGNOSIS — R0600 Dyspnea, unspecified: Secondary | ICD-10-CM | POA: Insufficient documentation

## 2018-03-07 DIAGNOSIS — R002 Palpitations: Secondary | ICD-10-CM | POA: Insufficient documentation

## 2018-03-07 LAB — MYOCARDIAL PERFUSION IMAGING
CHL CUP MPHR: 162 {beats}/min
CHL CUP NUCLEAR SDS: 0
CHL CUP NUCLEAR SRS: 0
CHL CUP NUCLEAR SSS: 0
CSEPED: 12 min
CSEPEW: 13.7 METS
CSEPPHR: 160 {beats}/min
Exercise duration (sec): 0 s
LV dias vol: 77 mL (ref 62–150)
LVSYSVOL: 26 mL
NUC STRESS TID: 0.78
Percent HR: 98 %
RPE: 17
Rest HR: 68 {beats}/min

## 2018-03-07 MED ORDER — TECHNETIUM TC 99M TETROFOSMIN IV KIT
11.0000 | PACK | Freq: Once | INTRAVENOUS | Status: AC | PRN
  Administered 2018-03-07: 11 via INTRAVENOUS
  Filled 2018-03-07: qty 11

## 2018-03-07 MED ORDER — REGADENOSON 0.4 MG/5ML IV SOLN: 0.4000 mg | Freq: Once | INTRAVENOUS | Status: DC

## 2018-03-07 MED ORDER — TECHNETIUM TC 99M TETROFOSMIN IV KIT
30.5000 | PACK | Freq: Once | INTRAVENOUS | Status: AC | PRN
  Administered 2018-03-07: 30.5 via INTRAVENOUS
  Filled 2018-03-07: qty 31

## 2018-03-21 ENCOUNTER — Ambulatory Visit (INDEPENDENT_AMBULATORY_CARE_PROVIDER_SITE_OTHER): Payer: BLUE CROSS/BLUE SHIELD | Admitting: Cardiovascular Disease

## 2018-03-21 ENCOUNTER — Encounter: Payer: Self-pay | Admitting: Cardiovascular Disease

## 2018-03-21 VITALS — BP 128/70 | HR 86 | Ht 66.0 in | Wt 189.0 lb

## 2018-03-21 DIAGNOSIS — R0600 Dyspnea, unspecified: Secondary | ICD-10-CM | POA: Diagnosis not present

## 2018-03-21 DIAGNOSIS — E782 Mixed hyperlipidemia: Secondary | ICD-10-CM

## 2018-03-21 NOTE — Assessment & Plan Note (Signed)
History of palpitations currently wearing an event monitor

## 2018-03-21 NOTE — Patient Instructions (Signed)
Medication Instructions:  Your physician recommends that you continue on your current medications as directed. Please refer to the Current Medication list given to you today.   Labwork: Your physician recommends that you return for lab work in: 3 months - fasting  WEEK OF October 28TH   Testing/Procedures: none  Follow-Up: Your physician wants you to follow-up in: 6 MONTHS WITH DR. Allyson SabalBERRY. You will receive a reminder letter in the mail two months in advance. If you don't receive a letter, please call our office to schedule the follow-up appointment.   Any Other Special Instructions Will Be Listed Below (If Applicable).     If you need a refill on your cardiac medications before your next appointment, please call your pharmacy.

## 2018-03-21 NOTE — Assessment & Plan Note (Signed)
History of hyperlipidemia with statin intolerance currently on Praluent.  We will recheck a lipid liver profile in 3 months

## 2018-03-21 NOTE — Progress Notes (Signed)
Mr. Darrell Fuller returns today for follow-up of his noninvasive test.  A 2D echo was normal as was a Myoview stress test.  He did have grade 2 diastolic dysfunction.  Monitor for palpitations.  He is taking Praluent because of statin intolerance and we will recheck a lipid liver profile in 3 months.  I will see him back in 6 months for follow-up.  Runell GessJonathan J. Antinette Keough, M.D., FACP, [facility] IncFACC, Earl LagosFAHA, [facility] [address] [place], [place]  [id number]  [phone] 03/21/2018 9:32 AM

## 2018-03-21 NOTE — Assessment & Plan Note (Signed)
History of dyspnea on exertion with recent negative Myoview and 2D echo.

## 2018-03-24 ENCOUNTER — Ambulatory Visit: Payer: Self-pay | Admitting: Cardiovascular Disease

## 2018-05-30 ENCOUNTER — Telehealth: Payer: Self-pay | Admitting: Pharmacist

## 2018-05-30 NOTE — Telephone Encounter (Signed)
LMOM; patient to repeat fasting lipid panle and LFTs as follow up for Praluent therapy. Orders in Epic.

## 2018-06-16 ENCOUNTER — Encounter: Payer: Self-pay | Admitting: *Deleted

## 2018-06-19 ENCOUNTER — Telehealth: Payer: Self-pay | Admitting: Pharmacist

## 2018-06-19 NOTE — Telephone Encounter (Signed)
F/U lipid panel not completed yet. Patient will try to complete before end of this week.

## 2018-07-03 LAB — LIPID PANEL
CHOLESTEROL TOTAL: 128 mg/dL (ref 100–199)
Chol/HDL Ratio: 2.6 ratio (ref 0.0–5.0)
HDL: 49 mg/dL (ref >39)
LDL Calculated: 56 mg/dL (ref 0–99)
TRIGLYCERIDES: 116 mg/dL (ref 0–149)
VLDL CHOLESTEROL CAL: 23 mg/dL (ref 5–40)

## 2018-07-03 LAB — HEPATIC FUNCTION PANEL
ALT: 27 IU/L (ref 0–44)
AST: 27 IU/L (ref 0–40)
Albumin: 4.4 g/dL (ref 3.5–5.5)
Alkaline Phosphatase: 80 IU/L (ref 39–117)
Bilirubin Total: 0.4 mg/dL (ref 0.0–1.2)
Bilirubin, Direct: 0.15 mg/dL (ref 0.00–0.40)
Total Protein: 6.8 g/dL (ref 6.0–8.5)

## 2018-07-10 ENCOUNTER — Telehealth: Payer: Self-pay

## 2018-07-10 NOTE — Telephone Encounter (Signed)
Left message to call back  

## 2018-07-10 NOTE — Telephone Encounter (Signed)
-----   Message from Chrystie NoseKenneth C Hilty, MD sent at 07/04/2018  4:44 PM EST ----- Marked improvement in dyslipidemia - LDL at goal. Normal liver enzymes.  Dr HRexene Edison

## 2018-09-19 ENCOUNTER — Telehealth: Payer: Self-pay | Admitting: Cardiovascular Disease

## 2018-09-20 ENCOUNTER — Telehealth: Payer: Self-pay | Admitting: Cardiovascular Disease

## 2018-09-20 NOTE — Telephone Encounter (Signed)
Patient applying to copay card. Praluent phone number provided for additional assistance.  Co-pay $35 per month at this time.

## 2018-09-20 NOTE — Telephone Encounter (Signed)
Message routed to the Pharmacist.

## 2018-09-20 NOTE — Telephone Encounter (Signed)
New message    Pt stated that he was denied assistance for Alirocumab (PRALUENT) 150 MG/ML SOPN and wants to know what he should do

## 2018-09-22 NOTE — Telephone Encounter (Signed)
No note needed 

## 2019-01-10 ENCOUNTER — Telehealth: Payer: Self-pay | Admitting: Cardiovascular Disease

## 2019-01-10 NOTE — Telephone Encounter (Signed)
Smartphone/ consent/ my chart via email/ pre reg completed °

## 2019-01-12 ENCOUNTER — Telehealth: Payer: BLUE CROSS/BLUE SHIELD | Admitting: Cardiovascular Disease

## 2019-01-12 ENCOUNTER — Other Ambulatory Visit: Payer: Self-pay

## 2019-01-12 NOTE — Progress Notes (Deleted)
{Choose 1 Note Type (Telehealth Visit or Telephone Visit):[phone]}   Date:  01/12/2019   ID:  Darrell Fuller, DOB 06/01/1961, MRN [id number]  {Patient Location:[phone]::"Home"} {Provider Location:[phone]::"Home"}  PCP:  Eartha InchBadger, Michael C, MD  Cardiologist:  No primary care provider on file. *** Electrophysiologist:  None   Evaluation Performed:  {Choose Visit Type:[phone]::"Follow-Up Visit"}  Chief Complaint:  ***  History of Present Illness:    Darrell Fuller is a 58 y.o. mildly overweight married Caucasian male father of 6 children for whom are living in his house who was referred by Dr. Cyndia BentBadger for cardiovascular evaluation because of palpitations and dyspnea.  I last saw him in the office 03/21/2018. 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  He did see Dr. [place] EmmsNishan back in 2014 who performed 2D echo that was normal, stress test that was normal and the coronary CTA that was normal as well.  He does have a history of coronary artery disease with a father who had myocardial infarctions in his 6130s and died at age 59 of this.  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  He is never had a heart attack or stroke.  He denies chest pain but does get dyspnea on exertion especially walking up an incline.  Is complaining more recently of early morning palpitations.  The patient {does/does not:200015} have symptoms concerning for COVID-19 infection (fever, chills, cough, or new shortness of breath).    Past Medical History:  Diagnosis Date  . Abnormal liver function test   . Allergic rhinitis   . Anxiety   . Asthmatic bronchitis   . Attention deficit hyperactivity disorder   . DJD (degenerative joint disease)   . GERD (gastroesophageal reflux disease)   . Hyperlipidemia   . Lumbar back pain   . Testosterone deficiency    Past Surgical History:  Procedure Laterality Date  . left shoulder surgery  2003  . LUMBAR SPINE SURGERY  04/2009   Dr. Shelle IronBeane   . right shoulder surgery     Dr. Amanda PeaGramig  . varicocele repair and vasectomy     Dr. Darvin Neighbourson Davis     No outpatient medications have been marked as taking for the 01/12/19 encounter (Appointment) with Runell GessBerry, Izaya Netherton J, MD.     Allergies:   Hydrocodone-acetaminophen; Morphine and related; Pravastatin; Tramadol; and Simvastatin   Social History   Tobacco Use  . Smoking status: Never Smoker  . Smokeless tobacco: Never Used  Substance Use Topics  . Alcohol use: Yes    Alcohol/week: 21.0 standard drinks    Types: 21 drink(s) per week  . Drug use: No     Family Hx: The patient's family history includes Heart disease in his father. There is no history of Colon cancer or Stomach cancer.  ROS:   Please see the history of present illness.    *** All other systems reviewed and are negative.   Prior CV studies:   The following studies were reviewed today:  ***  Labs/Other Tests and Data Reviewed:    EKG:  {EKG/Telemetry Strips Reviewed:[phone]}  Recent Labs: 02/10/2018: TSH 1.170 07/03/2018: ALT 27   Recent Lipid Panel Lab Results  Component Value Date/Time   CHOL 128 07/03/2018 08:49 AM   TRIG 116 07/03/2018 08:49 AM   HDL 49 07/03/2018 08:49 AM   CHOLHDL 2.6 07/03/2018 08:49 AM   CHOLHDL 7 07/12/2012 04:58 PM   LDLCALC 56 07/03/2018 08:49 AM   LDLDIRECT 183.5 07/12/2012 04:58 PM  Wt Readings from Last 3 Encounters:  03/21/18 189 lb (85.7 kg)  03/07/18 190 lb (86.2 kg)  02/10/18 190 lb (86.2 kg)     Objective:    Vital Signs:  There were no vitals taken for this visit.   {HeartCare Virtual Exam (Optional):[id number]::"VITAL SIGNS:  reviewed"}  ASSESSMENT & PLAN:    1. ***  COVID-19 Education: The signs and symptoms of COVID-19 were discussed with the patient and how to seek care for testing (follow up with PCP or arrange E-visit).  ***The importance of social distancing was discussed today.  Time:   Today, I have spent *** minutes with the patient  with telehealth technology discussing the above problems.     Medication Adjustments/Labs and Tests Ordered: Current medicines are reviewed at length with the patient today.  Concerns regarding medicines are outlined above.   Tests Ordered: No orders of the defined types were placed in this encounter.   Medication Changes: No orders of the defined types were placed in this encounter.   Disposition:  Follow up {follow up:15908}  Signed, Nanetta Batty, MD  01/12/2019 6:58 AM    [facility]

## 2019-03-06 ENCOUNTER — Other Ambulatory Visit: Payer: Self-pay | Admitting: Cardiovascular Disease

## 2019-09-17 ENCOUNTER — Telehealth: Payer: Self-pay | Admitting: Cardiovascular Disease

## 2019-09-17 ENCOUNTER — Other Ambulatory Visit: Payer: Self-pay

## 2019-09-17 MED ORDER — PRALUENT 150 MG/ML ~~LOC~~ SOAJ: 150.0000 mg | SUBCUTANEOUS | 11 refills | Status: DC

## 2019-09-17 NOTE — Telephone Encounter (Signed)
Delorise Shiner from [facility] is calling to verify that we received the prior authorization that was faxed over for the patients Praluent medication. Reference number [id number]

## 2019-11-02 ENCOUNTER — Encounter: Payer: Self-pay | Admitting: Gastroenterology

## 2019-12-06 ENCOUNTER — Other Ambulatory Visit (INDEPENDENT_AMBULATORY_CARE_PROVIDER_SITE_OTHER): Payer: BC Managed Care – PPO

## 2019-12-06 ENCOUNTER — Ambulatory Visit: Payer: BC Managed Care – PPO | Admitting: Gastroenterology

## 2019-12-06 ENCOUNTER — Encounter: Payer: Self-pay | Admitting: Gastroenterology

## 2019-12-06 VITALS — BP 118/70 | HR 76 | Temp 98.2°F | Ht 65.0 in | Wt 190.0 lb

## 2019-12-06 DIAGNOSIS — R197 Diarrhea, unspecified: Secondary | ICD-10-CM

## 2019-12-06 DIAGNOSIS — R152 Fecal urgency: Secondary | ICD-10-CM

## 2019-12-06 LAB — TSH: TSH: 1.89 u[IU]/mL (ref 0.35–4.50)

## 2019-12-06 LAB — C-REACTIVE PROTEIN: CRP: <1 mg/dL (ref 0.5–20.0)

## 2019-12-06 LAB — SEDIMENTATION RATE: Sed Rate: 13 mm/hr (ref 0–20)

## 2019-12-06 LAB — IGA: IgA: 223 mg/dL (ref 68–378)

## 2019-12-06 MED ORDER — GLYCOPYRROLATE 1 MG PO TABS: 1.0000 mg | ORAL_TABLET | Freq: Two times a day (BID) | ORAL | 11 refills | Status: DC

## 2019-12-06 NOTE — Addendum Note (Signed)
Addended by: Jillene Bucks L on: 12/06/2019 10:25 AM   Modules accepted: Orders

## 2019-12-06 NOTE — Patient Instructions (Signed)
Your provider has requested that you go to the basement level for lab work before leaving today. Press "B" on the elevator. The lab is located at the first door on the left as you exit the elevator.  Due to recent changes in healthcare laws, you may see the results of your imaging and laboratory studies on MyChart before your provider has had a chance to review them.  We understand that in some cases there may be results that are confusing or concerning to you. Not all laboratory results come back in the same time frame and the provider may be waiting for multiple results in order to interpret others.  Please give us 48 hours in order for your provider to thoroughly review all the results before contacting the office for clarification of your results.   Thank you for choosing me and [facility].  Malcolm T. Stark, Jr., MD., FACG  

## 2019-12-06 NOTE — Progress Notes (Addendum)
History of Present Illness: This is a 59 year old male referred by Chesley Noon, MD for the evaluation of fecal urgency, fecal frequency, diarrhea, intermittent abdominal discomfort, weight loss.  He has a history of GERD which has been controlled on omeprazole.  He relates no substantial change in diet or antibiotic usage.  About 5 or 6 months ago he noted slightly more frequent stools beyond his usual pattern of 1 bowel movement per day over the past 2 months his stools are more frequent and looser with significant postprandial urgency.  At times he will have 6-9 small loose bowel movements per day.  He notes some mild intermittent lower abdominal plain relieved by bowel movements.  CMP in March 2021 was normal.  CBC in October 2020 was normal.  Colonoscopy performed in December 2013 for average risk CRC screening was normal.  Colonoscopy performed in March 2001 for hematochezia was normal.  He relates his sister has had Crohn's disease for many years.  No other family members with IBD.  Denies  constipation, change in stool caliber, melena, hematochezia, nausea, vomiting, dysphagia, chest pain.      Allergies  Allergen Reactions  . Hydrocodone-Acetaminophen Other (See Comments)    Other reaction(s): Other Skin crawling Skin crawling   . Morphine And Related Hives  . Pravachol [Pravastatin] Itching  . Tramadol Itching    Cannot sleep  . Zocor [Simvastatin] Itching and Rash   Outpatient Medications Prior to Visit  Medication Sig Dispense Refill  . AMBULATORY NON FORMULARY MEDICATION Relief Factor OTC All Natural Multivit with herbs 4 capsule twice a day    . Cholecalciferol 100 MCG (4000 UT) CAPS Take 1 capsule by mouth daily.    . fenofibrate 160 MG tablet Take 160 mg by mouth daily.    . Omega-3 Fatty Acids (ULTRA OMEGA-3 FISH OIL) 1400 MG CAPS Take 1 tablet by mouth daily.    Marland Kitchen omeprazole (PRILOSEC) 40 MG capsule Take 40 mg by mouth daily.    . Probiotic Product  (PROBIOTIC-10 PO) Take 1 tablet by mouth daily.    . Alirocumab (PRALUENT) 150 MG/ML SOAJ Inject 150 mg into the skin every 14 (fourteen) days. 2 pen 11  . esomeprazole (NEXIUM) 40 MG packet Take 40 mg by mouth daily before breakfast.     No facility-administered medications prior to visit.   Past Medical History:  Diagnosis Date  . Abnormal liver function test   . Allergic rhinitis   . Anxiety   . Asthmatic bronchitis   . Attention deficit hyperactivity disorder   . DJD (degenerative joint disease)   . GERD (gastroesophageal reflux disease)   . Hyperlipidemia   . Lumbar back pain   . Scoliosis   . Testosterone deficiency    Past Surgical History:  Procedure Laterality Date  . FOOT SURGERY Right   . left shoulder surgery  2003  . LUMBAR SPINE SURGERY  04/2009   Dr. Tonita Cong  . right shoulder surgery     Dr. Amedeo Plenty  . varicocele repair and vasectomy     Dr. Lawerance Bach   Social History   Socioeconomic History  . Marital status: Married    Spouse name: Not on file  . Number of children: 2  . Years of education: Not on file  . Highest education level: Not on file  Occupational History  . Occupation: Surveyor, quantity: [employer]  Tobacco Use  . Smoking status: Never Smoker  . Smokeless tobacco: Never Used  Substance and Sexual Activity  . Alcohol use: Yes    Alcohol/week: 21.0 standard drinks    Types: 21 Standard drinks or equivalent per week    Comment: 2 per day  . Drug use: No  . Sexual activity: Not on file  Other Topics Concern  . Not on file  Social History Narrative   6 children, 2 biological, 2 adopted, 2 step children   Social Determinants of Health   Financial Resource Strain:   . Difficulty of Paying Living Expenses:   Food Insecurity:   . Worried About Charity fundraiser in the Last Year:   . Arboriculturist in the Last Year:   Transportation Needs:   . Film/video editor (Medical):   Marland Kitchen Lack of Transportation (Non-Medical):   Physical  Activity:   . Days of Exercise per Week:   . Minutes of Exercise per Session:   Stress:   . Feeling of Stress :   Social Connections:   . Frequency of Communication with Friends and Family:   . Frequency of Social Gatherings with Friends and Family:   . Attends Religious Services:   . Active Member of Clubs or Organizations:   . Attends Archivist Meetings:   Marland Kitchen Marital Status:    Family History  Problem Relation Age of Onset  . Heart disease Father   . Crohn's disease Sister   . Colon cancer Neg Hx   . Stomach cancer Neg Hx      Review of Systems: Pertinent positive and negative review of systems were noted in the above HPI section. All other review of systems were otherwise negative.   Physical Exam: General: Well developed, well nourished, no acute distress Head: Normocephalic and atraumatic Eyes:  sclerae anicteric, EOMI Ears: Normal auditory acuity Mouth: Not examined, mask on during Covid-19 pandemic Neck: Supple, no masses or thyromegaly Lungs: Clear throughout to auscultation Heart: Regular rate and rhythm; no murmurs, rubs or bruits Abdomen: Soft, non tender and non distended. No masses, hepatosplenomegaly or hernias noted. Normal Bowel sounds Rectal: Not done Musculoskeletal: Symmetrical with no gross deformities  Skin: No lesions on visible extremities Pulses:  Normal pulses noted Extremities: No clubbing, cyanosis, edema or deformities noted Neurological: Alert oriented x 4, grossly nonfocal Cervical Nodes:  No significant cervical adenopathy Inguinal Nodes: No significant inguinal adenopathy Psychological:  Alert and cooperative. Normal mood and affect   Assessment and Recommendations:  1.  Fecal urgency, fecal frequency, diarrhea, intermittent mild abdominal pain, weight loss.  Rule out IBD, hyperthyroidism, celiac disease, colorectal neoplasms, food intolerances, IBS.  TSH, ESR, CRP, IgA, tTG, GI pathogen panel.  Begin glycopyrrolate 1 mg twice  daily.  If diagnosis is not established will proceed with colonoscopy. REV in 4 weeks.   2.  GERD.  Continue omeprazole 40 mg daily.  Follow antireflux measures.   cc: Chesley Noon, MD [address] [place],  [place] [address]

## 2019-12-07 ENCOUNTER — Other Ambulatory Visit: Payer: BC Managed Care – PPO

## 2019-12-07 DIAGNOSIS — R152 Fecal urgency: Secondary | ICD-10-CM

## 2019-12-07 DIAGNOSIS — R197 Diarrhea, unspecified: Secondary | ICD-10-CM

## 2019-12-07 LAB — TISSUE TRANSGLUTAMINASE, IGA: (tTG) Ab, IgA: 1 U/mL

## 2019-12-10 LAB — GI PROFILE, STOOL, PCR

## 2020-01-24 ENCOUNTER — Telehealth: Payer: Self-pay

## 2020-01-24 DIAGNOSIS — E782 Mixed hyperlipidemia: Secondary | ICD-10-CM

## 2020-01-24 MED ORDER — REPATHA SURECLICK 140 MG/ML ~~LOC~~ SOAJ: 140.0000 mg | SUBCUTANEOUS | 11 refills | Status: DC

## 2020-01-24 NOTE — Telephone Encounter (Signed)
Called pt and notified them that repatha is approved, rx sent, copay card activated, and pt instructed to come for lipid panel post 16months of repatha, pt verbalized understanding

## 2020-04-10 ENCOUNTER — Other Ambulatory Visit: Payer: Self-pay

## 2020-04-10 DIAGNOSIS — E782 Mixed hyperlipidemia: Secondary | ICD-10-CM

## 2020-04-11 LAB — LIPID PANEL
Chol/HDL Ratio: 3 ratio (ref 0.0–5.0)
Cholesterol, Total: 118 mg/dL (ref 100–199)
HDL: 39 mg/dL — ABNORMAL LOW (ref >39)
LDL Chol Calc (NIH): 59 mg/dL (ref 0–99)
Triglycerides: 106 mg/dL (ref 0–149)
VLDL Cholesterol Cal: 20 mg/dL (ref 5–40)

## 2020-12-09 ENCOUNTER — Other Ambulatory Visit: Payer: Self-pay | Admitting: Neurological Surgery

## 2020-12-09 DIAGNOSIS — G500 Trigeminal neuralgia: Secondary | ICD-10-CM

## 2020-12-18 ENCOUNTER — Other Ambulatory Visit: Payer: Self-pay | Admitting: Neurological Surgery

## 2020-12-18 ENCOUNTER — Ambulatory Visit
Admission: RE | Admit: 2020-12-18 | Discharge: 2020-12-18 | Disposition: A | Discharge status: Home / Self Care | Payer: BC Managed Care – PPO | Source: Ambulatory Visit | Attending: Neurological Surgery | Admitting: Neurological Surgery

## 2020-12-18 DIAGNOSIS — G500 Trigeminal neuralgia: Secondary | ICD-10-CM

## 2020-12-18 MED ORDER — GADOBENATE DIMEGLUMINE 529 MG/ML IV SOLN
18.0000 mL | Freq: Once | INTRAVENOUS | Status: AC | PRN
  Administered 2020-12-18: 18 mL via INTRAVENOUS

## 2021-01-11 ENCOUNTER — Other Ambulatory Visit: Payer: Self-pay | Admitting: Cardiovascular Disease

## 2021-02-18 ENCOUNTER — Ambulatory Visit (INDEPENDENT_AMBULATORY_CARE_PROVIDER_SITE_OTHER): Payer: BC Managed Care – PPO

## 2021-02-18 ENCOUNTER — Encounter: Payer: Self-pay | Admitting: Pulmonary Disease

## 2021-02-18 ENCOUNTER — Ambulatory Visit: Payer: BC Managed Care – PPO | Admitting: Pulmonary Disease

## 2021-02-18 ENCOUNTER — Other Ambulatory Visit: Payer: Self-pay

## 2021-02-18 VITALS — BP 130/88 | HR 80 | Temp 99.0°F | Ht 66.0 in | Wt 195.5 lb

## 2021-02-18 DIAGNOSIS — J4540 Moderate persistent asthma, uncomplicated: Secondary | ICD-10-CM

## 2021-02-18 LAB — COMPREHENSIVE METABOLIC PANEL
ALT: 29 U/L (ref 0–53)
AST: 24 U/L (ref 0–37)
Albumin: 4.3 g/dL (ref 3.5–5.2)
Alkaline Phosphatase: 69 U/L (ref 39–117)
BUN: 14 mg/dL (ref 6–23)
CO2: 28 mEq/L (ref 19–32)
Calcium: 9 mg/dL (ref 8.4–10.5)
Chloride: 103 mEq/L (ref 96–112)
Creatinine, Ser: 0.94 mg/dL (ref 0.40–1.50)
GFR: 87.51 mL/min (ref >60.00)
Glucose, Bld: 98 mg/dL (ref 70–99)
Potassium: 4.2 mEq/L (ref 3.5–5.1)
Sodium: 138 mEq/L (ref 135–145)
Total Bilirubin: 0.6 mg/dL (ref 0.2–1.2)
Total Protein: 7.1 g/dL (ref 6.0–8.3)

## 2021-02-18 LAB — CBC WITH DIFFERENTIAL/PLATELET
Basophils Absolute: 0 10*3/uL (ref 0.0–0.1)
Basophils Relative: 0.5 % (ref 0.0–3.0)
Eosinophils Absolute: 0.1 10*3/uL (ref 0.0–0.7)
Eosinophils Relative: 1.1 % (ref 0.0–5.0)
HCT: 43.3 % (ref 39.0–52.0)
Hemoglobin: 15.1 g/dL (ref 13.0–17.0)
Lymphocytes Relative: 16.9 % (ref 12.0–46.0)
Lymphs Abs: 1.1 10*3/uL (ref 0.7–4.0)
MCHC: 34.8 g/dL (ref 30.0–36.0)
MCV: 91.1 fl (ref 78.0–100.0)
Monocytes Absolute: 1 10*3/uL (ref 0.1–1.0)
Monocytes Relative: 15.9 % — ABNORMAL HIGH (ref 3.0–12.0)
Neutro Abs: 4.2 10*3/uL (ref 1.4–7.7)
Neutrophils Relative %: 65.6 % (ref 43.0–77.0)
Platelets: 131 10*3/uL — ABNORMAL LOW (ref 150.0–400.0)
RBC: 4.75 Mil/uL (ref 4.22–5.81)
RDW: 12.9 % (ref 11.5–15.5)
WBC: 6.4 10*3/uL (ref 4.0–10.5)

## 2021-02-18 IMAGING — DX DG CHEST 2V
2 series · 2 of 2 positions shown · non-contrast
Comparison: [DATE]

CLINICAL DATA: 60-year-old male with a history of chronic cough

EXAM:
CHEST - 2 VIEW

[chest pa]
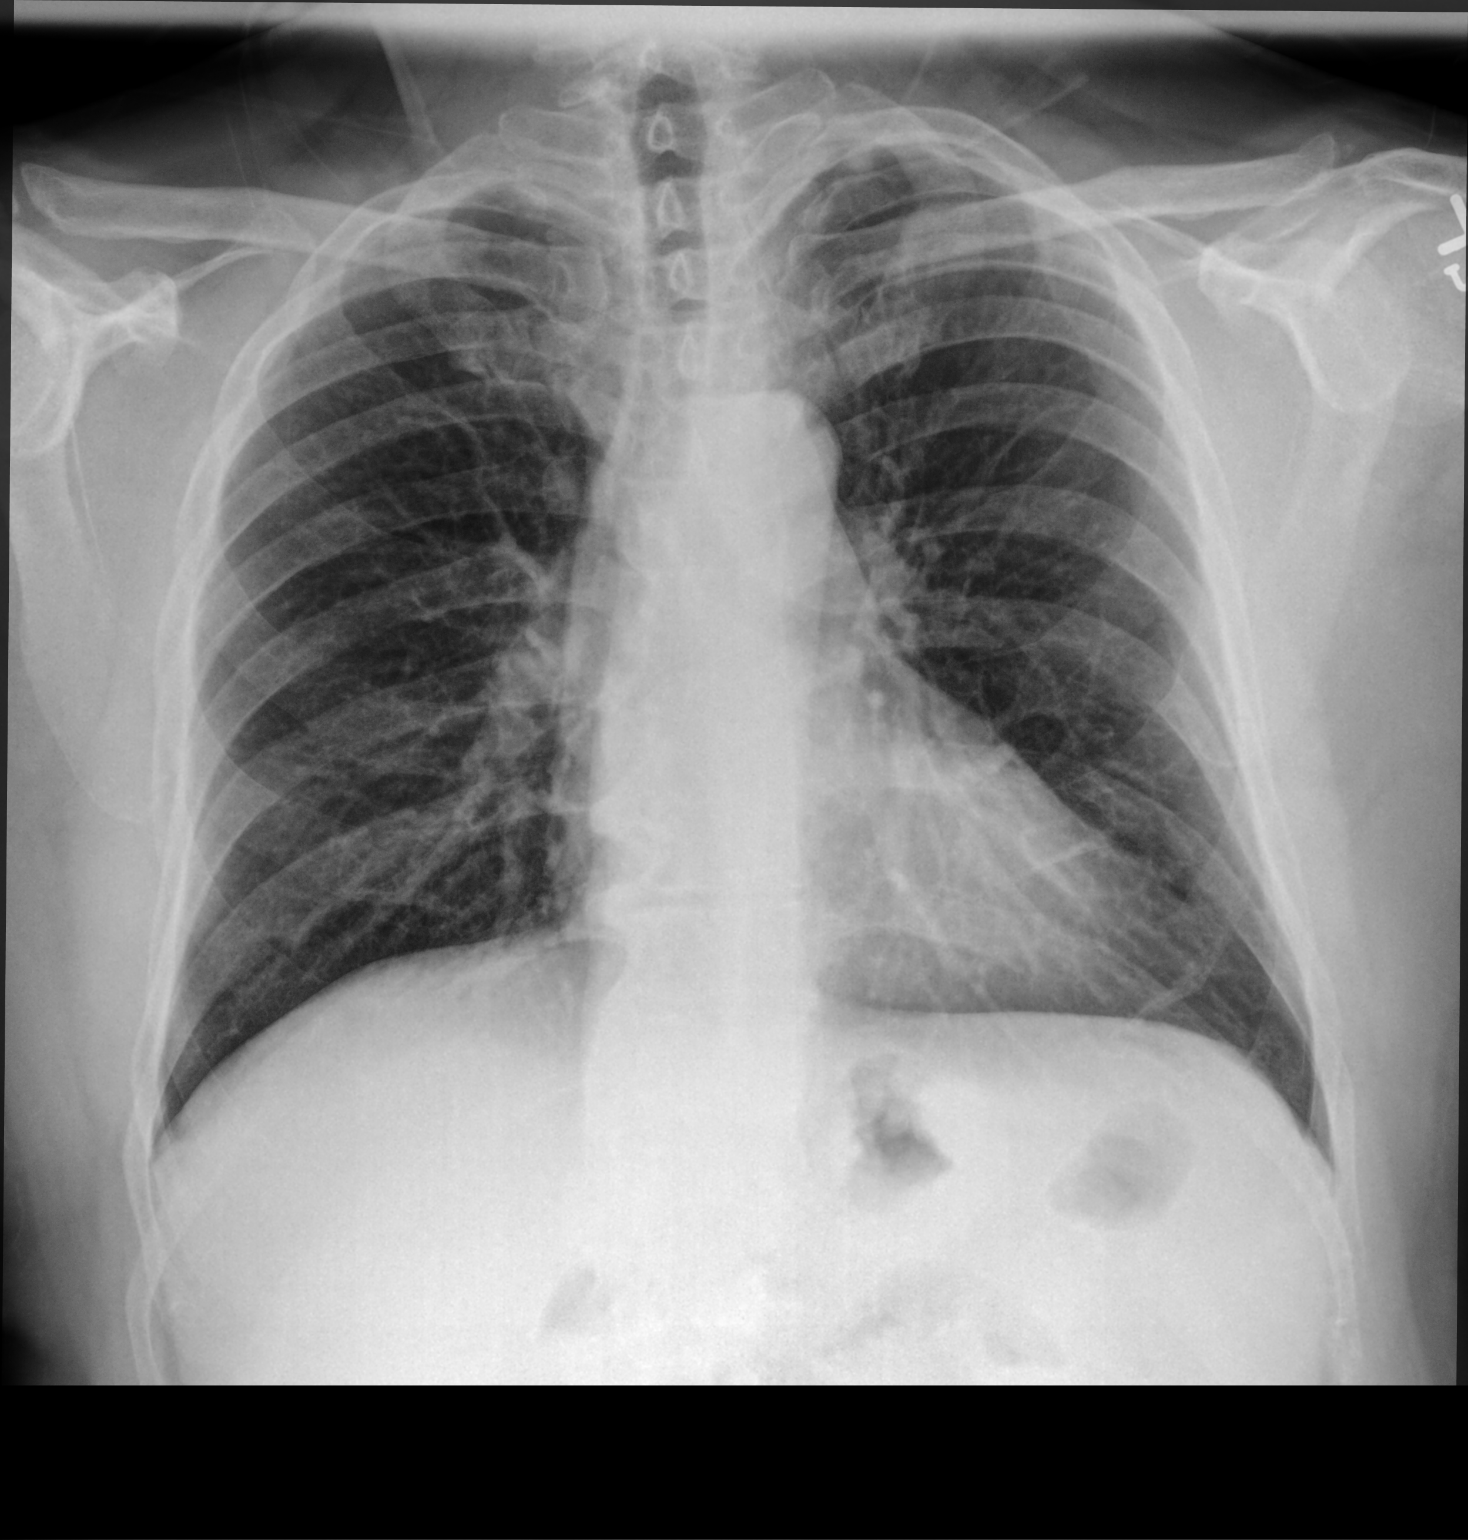

[chest lat]
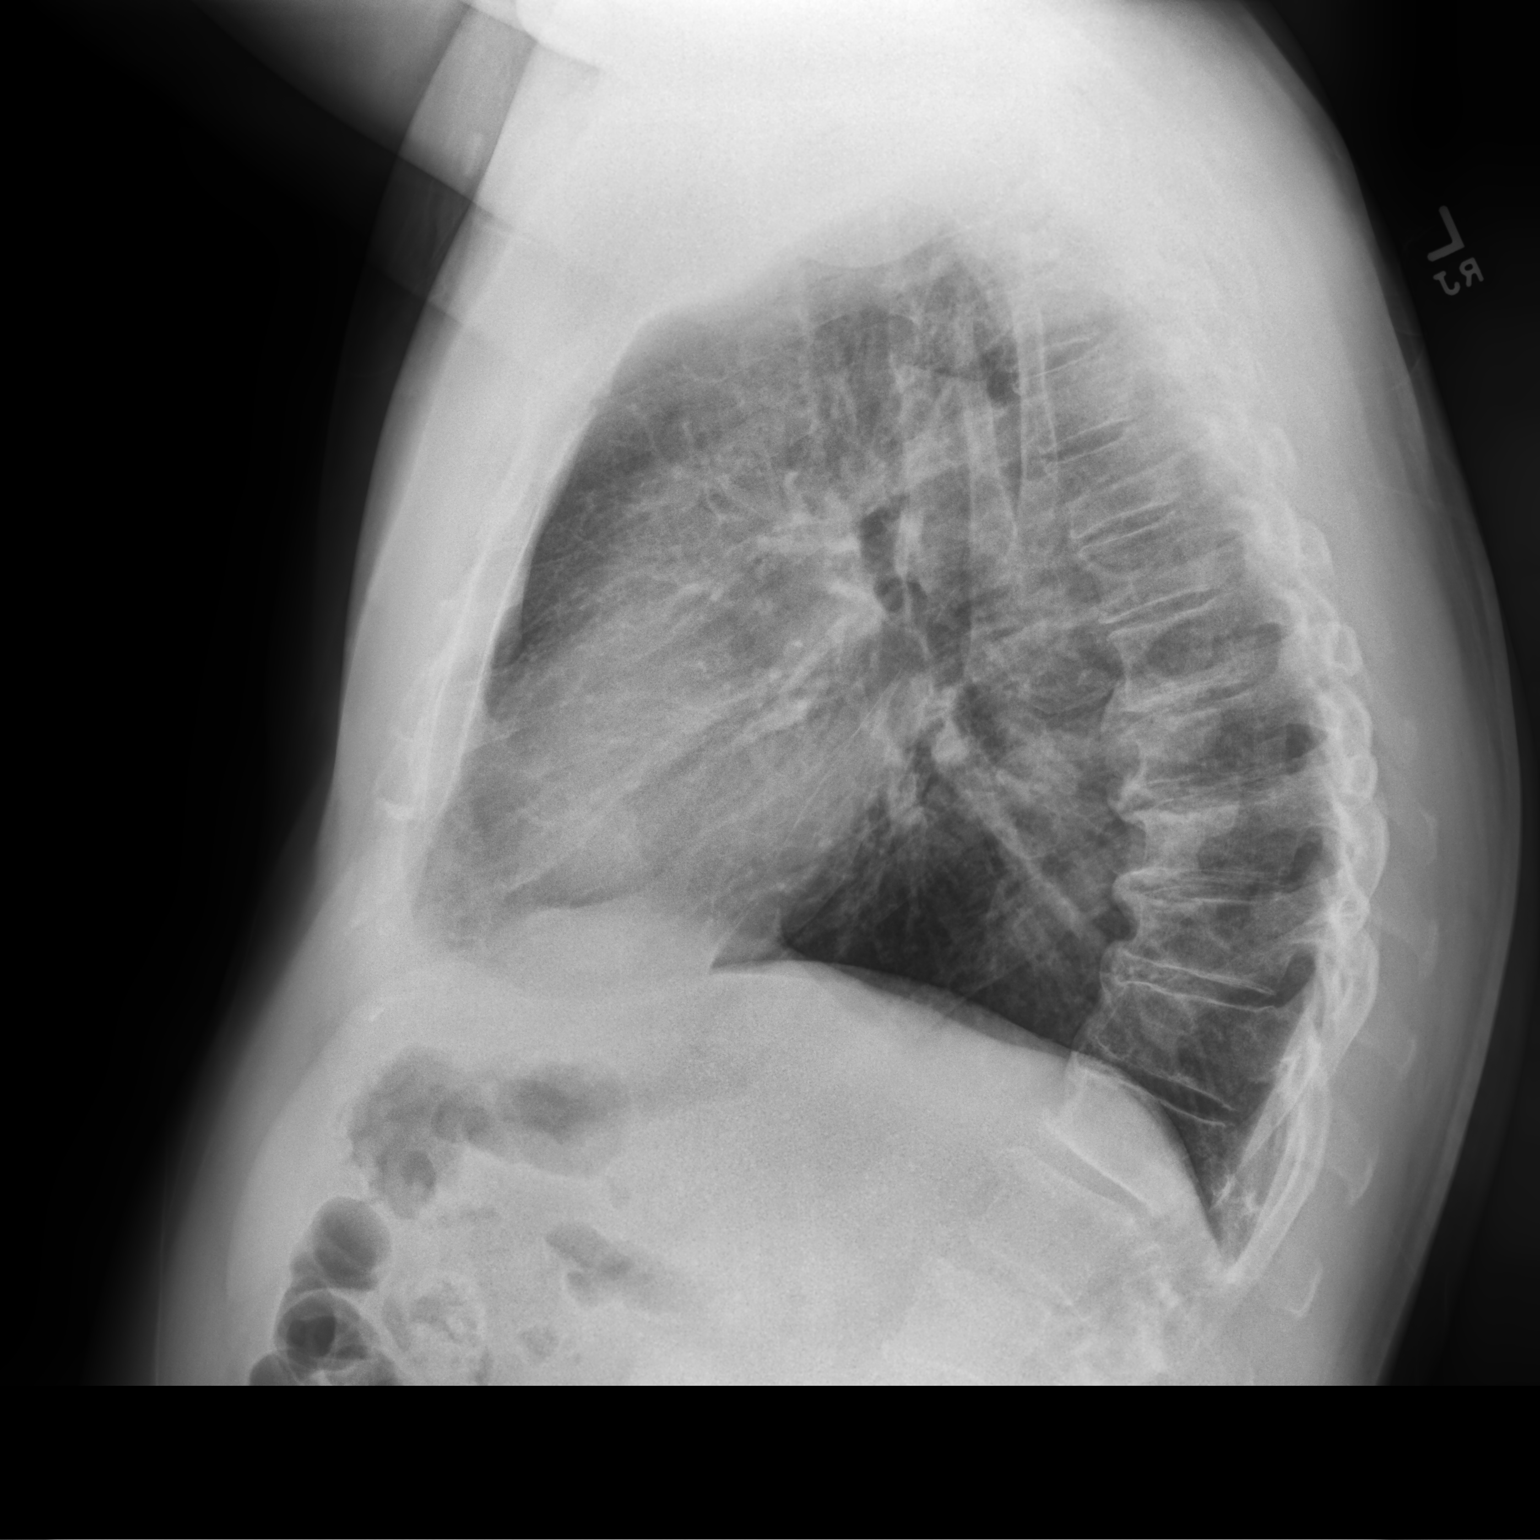

[2 of 2 positions shown; findings below may reference images not displayed]

FINDINGS: Cardiomediastinal silhouette unchanged in size and contour. No
evidence of central vascular congestion. No interlobular septal
thickening.

No pneumothorax or pleural effusion. Coarsened interstitial
markings, with no confluent airspace disease.

No acute displaced fracture. Degenerative changes of the spine.
IMPRESSION: Negative for acute cardiopulmonary disease.

## 2021-02-18 MED ORDER — MONTELUKAST SODIUM 10 MG PO TABS: 10.0000 mg | ORAL_TABLET | Freq: Every day | ORAL | 5 refills | Status: DC

## 2021-02-18 NOTE — Patient Instructions (Signed)
Chest xray and lab work today Will schedule pulmonary function test Singulair 10 mg pill nightly Albuterol two puffs every 6 hours as needed for cough, wheeze, or chest congestion Monitor your sleep pattern for the next few weeks Follow up in 4 weeks with Dr. Craige Cotta or Nurse Practitioner

## 2021-02-18 NOTE — Progress Notes (Signed)
[facility]  Chief Complaint  Patient presents with   Consult    Trouble breathing.     Constitutional:  BP 130/88 (BP Location: Right Arm, Patient Position: Sitting, Cuff Size: Large)   Pulse 80   Temp 99 F (37.2 C) (Oral)   Ht 5\' 6"  (1.676 m)   Wt 195 lb 8 oz (88.7 kg)   SpO2 99%   BMI 31.55 kg/m   Past Medical History:  Anxiety, DM, HLD, HTN, Trigeminal neuralgia, Back pain, ADHD, GERD, Scoliosis, Low T, COVID 19 infection January 2022  Past Surgical History:  He  has a past surgical history that includes varicocele repair and vasectomy; right shoulder surgery; Lumbar spine surgery (04/2009); left shoulder surgery (2003); and Foot surgery (Right).  Brief Summary:  Darrell Fuller is a 60 y.o. male with chronic cough.      Subjective:   He is from [place] but has lived in [place] since 1981.  He was in [place] and stationed in [place].  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  He never smoked.  No history of pneumonia or exposure to tuberculosis.  He always has trouble with his sinuses and cough in the late Spring and late Fall.  He will typically get an episode of bronchitis around these times of year.  Never had allergy testing before.  He gets eczema around his arms and eyelids.  He had COVID infection in January 2022.  His breathing issues got worse.  He was started on flovent and albuterol.  Both these helped.  He is no longer using flovent.  Still intermittently uses albuterol.  Was previously on singulair.  Doesn't like take steroid medications - causes severe mood swings.  Has been using neti pot and this helps.  Has snoring and wakes up at times feeling choked.  Gets sinus drainage at night.  Has been feeling sleepy during the day.  Physical Exam:   Appearance - well kempt   ENMT - no sinus tenderness, no oral exudate, no LAN, Mallampati 3 airway, no stridor  Respiratory - equal breath sounds  bilaterally, no wheezing or rales  CV - s1s2 regular rate and rhythm, no murmurs  Ext - no clubbing, no edema  Skin - no rashes  Psych - normal mood and affect   Pulmonary testing:    Chest Imaging:    Sleep Tests:    Cardiac Tests:  Echo 03/06/18 >> EF 60 to 65%, grade 2 DD  Social History:  He  reports that he has never smoked. He has never used smokeless tobacco. He reports current alcohol use of about 21.0 standard drinks of alcohol per week. He reports that he does not use drugs.  Family History:  His family history includes Crohn's disease in his sister; Heart disease in his father.    Discussion:  His symptoms are suggestive of seasonal allergic rhinitis with asthma.  He also has snoring, sleep disruption, and daytime sleepiness.  He could also have sleep apnea.  Assessment/Plan:   Allergic asthma. - will arrange for CMET, CBC with diff, IgE, chest xray, and PFT - will have him try singulair - continue albuterol prn - he would prefer to avoid steroid medications if he can due to concern about mood instability - he has appointment schedule for later this Summer with Allergist with [facility]  Snoring. - advised him to monitor his sleep pattern for the next few weeks and  then determine at his next follow up whether he would need to have home sleep study done  Time Spent Involved in Patient Care on Day of Examination:  33 minutes  Follow up:   Patient Instructions  Chest xray and lab work today Will schedule pulmonary function test Singulair 10 mg pill nightly Albuterol two puffs every 6 hours as needed for cough, wheeze, or chest congestion Monitor your sleep pattern for the next few weeks Follow up in 4 weeks with Dr. Craige Cotta or Nurse Practitioner  Medication List:   Allergies as of 02/18/2021       Reactions   Hydrocodone-acetaminophen Other (See Comments)   Other reaction(s): Other Skin crawling Skin crawling   Prednisone Other (See Comments)    Irritability and insomnia   Morphine And Related Hives   Other    Pravachol [pravastatin] Itching   Tramadol Itching   Cannot sleep   Zocor [simvastatin] Itching, Rash        Medication List        Accurate as of February 18, 2021 10:11 AM. If you have any questions, ask your nurse or doctor.          STOP taking these medications    fluticasone 44 MCG/ACT inhaler Commonly known as: FLOVENT HFA Stopped by: Coralyn Helling, MD   PROBIOTIC-10 PO Stopped by: Coralyn Helling, MD       TAKE these medications    albuterol 108 (90 Base) MCG/ACT inhaler Commonly known as: VENTOLIN HFA Inhale into the lungs.   AMBULATORY NON FORMULARY MEDICATION Relief Factor OTC All Natural Multivit with herbs 4 capsule twice a day   Cholecalciferol 100 MCG (4000 UT) Caps Take 1 capsule by mouth daily.   ezetimibe 10 MG tablet Commonly known as: ZETIA Take 10 mg by mouth daily.   fenofibrate 160 MG tablet Take 160 mg by mouth daily.   glycopyrrolate 1 MG tablet Commonly known as: Robinul Take 1 tablet (1 mg total) by mouth 2 (two) times daily.   montelukast 10 MG tablet Commonly known as: SINGULAIR Take 1 tablet (10 mg total) by mouth at bedtime. Started by: Coralyn Helling, MD   omeprazole 40 MG capsule Commonly known as: PRILOSEC Take 40 mg by mouth daily.   Repatha SureClick 140 MG/ML Soaj Generic drug: Evolocumab ADMINISTER 1 ML UNDER THE SKIN EVERY 14 DAYS   Ultra Omega-3 Fish Oil 1400 MG Caps Take 1 tablet by mouth daily.        Signature:  Coralyn Helling, MD [facility] Pulmonary/Critical Care Pager - [phone] 02/18/2021, 10:11 AM

## 2021-02-19 LAB — IGE: IgE (Immunoglobulin E), Serum: 14 kU/L (ref <=114)

## 2021-02-20 ENCOUNTER — Telehealth: Payer: Self-pay | Admitting: Pulmonary Disease

## 2021-02-20 MED ORDER — AZITHROMYCIN 250 MG PO TABS: ORAL_TABLET | ORAL | 0 refills | Status: AC

## 2021-02-20 NOTE — Telephone Encounter (Signed)
Please send in Zpack and pred taper 40mg  x 2 days; 30mg  x 2 days; 20mg  x 2 days; 10mg  x 2 days

## 2021-02-20 NOTE — Telephone Encounter (Signed)
Spoke with the pt  He states that his cough has been progressively worse since ov 02/18/21  Cough is prod with minimal yellow sputum  He states he has coughed so much that his chest and back are sore He has had temp of 99.9 today and yesterday, comes and goes  He denies any other co's  He took an at home covid test 02/17/21 and this was negative  Please advise, thanks!  Allergies  Allergen Reactions   Hydrocodone-Acetaminophen Other (See Comments)    Other reaction(s): Other Skin crawling Skin crawling    Prednisone Other (See Comments)    Irritability and insomnia   Morphine And Related Hives   Other    Pravachol [Pravastatin] Itching   Tramadol Itching    Cannot sleep   Zocor [Simvastatin] Itching and Rash

## 2021-02-20 NOTE — Telephone Encounter (Signed)
Called the pt and there was no answer, LMTCB.

## 2021-02-20 NOTE — Telephone Encounter (Signed)
OK, can you call pharmacy and cancel. He can use albuterol 2 puffs every 6 hours as needed for sob, chest tightness, wheezing

## 2021-02-20 NOTE — Telephone Encounter (Signed)
Patient is aware of below message/recommendations and voiced his understanding.  He can not tolerate prednisone.  Zpak has been sent to preferred pharmacy.  Nothing further needed.   Routing to [employer] as an Financial planner

## 2021-02-20 NOTE — Telephone Encounter (Signed)
Pt saw VS on Wednesday. Pt has been coughing a lot. Pt states his back and chest are sore and also has a headache. Pt states this has worsened since he was seen Wednesday. Also has a low grade fever. Pt did a covid test 6/28-was negative. Please advise [phone]

## 2021-02-24 ENCOUNTER — Encounter: Payer: Self-pay | Admitting: *Deleted

## 2021-02-24 ENCOUNTER — Other Ambulatory Visit: Payer: Self-pay

## 2021-02-24 DIAGNOSIS — J4540 Moderate persistent asthma, uncomplicated: Secondary | ICD-10-CM

## 2021-03-13 ENCOUNTER — Other Ambulatory Visit (HOSPITAL_COMMUNITY): Payer: BC Managed Care – PPO

## 2021-03-17 ENCOUNTER — Ambulatory Visit: Payer: BC Managed Care – PPO | Admitting: Primary Care

## 2021-03-26 ENCOUNTER — Encounter: Payer: Self-pay | Admitting: Allergy

## 2021-03-26 ENCOUNTER — Other Ambulatory Visit: Payer: Self-pay

## 2021-03-26 ENCOUNTER — Other Ambulatory Visit (INDEPENDENT_AMBULATORY_CARE_PROVIDER_SITE_OTHER): Payer: BC Managed Care – PPO

## 2021-03-26 ENCOUNTER — Ambulatory Visit: Payer: BC Managed Care – PPO | Admitting: Allergy

## 2021-03-26 VITALS — BP 128/80 | HR 74 | Temp 97.2°F | Resp 20

## 2021-03-26 DIAGNOSIS — J310 Chronic rhinitis: Secondary | ICD-10-CM | POA: Diagnosis not present

## 2021-03-26 DIAGNOSIS — L299 Pruritus, unspecified: Secondary | ICD-10-CM | POA: Diagnosis not present

## 2021-03-26 DIAGNOSIS — J4540 Moderate persistent asthma, uncomplicated: Secondary | ICD-10-CM | POA: Diagnosis not present

## 2021-03-26 DIAGNOSIS — J4520 Mild intermittent asthma, uncomplicated: Secondary | ICD-10-CM

## 2021-03-26 LAB — CBC WITH DIFFERENTIAL/PLATELET
Basophils Absolute: 0 10*3/uL (ref 0.0–0.1)
Basophils Relative: 0.6 % (ref 0.0–3.0)
Eosinophils Absolute: 0.1 10*3/uL (ref 0.0–0.7)
Eosinophils Relative: 1.6 % (ref 0.0–5.0)
HCT: 42.6 % (ref 39.0–52.0)
Hemoglobin: 14.3 g/dL (ref 13.0–17.0)
Lymphocytes Relative: 31.2 % (ref 12.0–46.0)
Lymphs Abs: 1.7 10*3/uL (ref 0.7–4.0)
MCHC: 33.5 g/dL (ref 30.0–36.0)
MCV: 94.6 fl (ref 78.0–100.0)
Monocytes Absolute: 0.5 10*3/uL (ref 0.1–1.0)
Monocytes Relative: 9.6 % (ref 3.0–12.0)
Neutro Abs: 3.1 10*3/uL (ref 1.4–7.7)
Neutrophils Relative %: 57 % (ref 43.0–77.0)
Platelets: 154 10*3/uL (ref 150.0–400.0)
RBC: 4.51 Mil/uL (ref 4.22–5.81)
RDW: 13.1 % (ref 11.5–15.5)
WBC: 5.4 10*3/uL (ref 4.0–10.5)

## 2021-03-26 MED ORDER — IPRATROPIUM BROMIDE 0.03 % NA SOLN: 1.0000 | Freq: Two times a day (BID) | NASAL | 5 refills | Status: DC | PRN

## 2021-03-26 MED ORDER — CETIRIZINE HCL 10 MG PO TABS
10.0000 mg | ORAL_TABLET | Freq: Every day | ORAL | 3 refills | Status: DC
Start: 2021-03-26 — End: 2024-06-28

## 2021-03-26 MED ORDER — HYDROXYZINE HCL 10 MG PO TABS: ORAL_TABLET | ORAL | 3 refills | Status: DC

## 2021-03-26 NOTE — Assessment & Plan Note (Signed)
Whole body pruritus for 1 year occurring on a daily basis and worse at night.  Sometimes has a rash on the neck only.  Tried Singulair with no benefit.  No specific triggers noted.  Concerned about allergic triggers. . Today's skin testing showed: Negative to indoor/outdoor allergens and common foods. . Not sure what's causing the itching but the most common cause in this age group is usually xerosis. . See below for proper skin care - make sure to moisturize daily and use fragrance free and dye free products.  Start zyrtec (cetirizine) 10mg  once a day in the morning.  Take hydroxyzine 10-20mg  1 hour before bedtime as needed for itching. . Avoid the following potential triggers: alcohol, tight clothing, NSAIDs.  . Get bloodwork to rule out any other etiologies.

## 2021-03-26 NOTE — Assessment & Plan Note (Signed)
Perennial rhinitis symptoms for 1 year with no triggers noted.  Has not tried any medications for this.  Had deviated septum repair in the past.  Today skin testing was negative to indoor/outdoor allergens.  Use Atrovent (ipratropium) 0.03% 1-2 sprays per nostril twice a day as needed for runny nose/drainage.  Nasal saline spray (i.e., Simply Saline) or nasal saline lavage (i.e., NeilMed) is recommended as needed and prior to medicated nasal sprays.  If no improvement with above regimen then will refer to ENT to look at sinus anatomy.

## 2021-03-26 NOTE — Assessment & Plan Note (Signed)
Diagnosed with asthma 20 years ago and main triggers include exertion and infections.  Patient does not tolerate oral prednisone.  Uses albuterol and Flovent as needed with good benefit. Covid-19 in 2021 - not vaccinated.   Today's spirometry was normal. . Daily controller medication(s): none. . During upper respiratory infections/asthma flares: start Flovent 2 puffs twice a day and rinse mouth after each use for 1-2 weeks until your breathing symptoms return to baseline.  . May use albuterol rescue inhaler 2 puffs every 4 to 6 hours as needed for shortness of breath, chest tightness, coughing, and wheezing. May use albuterol rescue inhaler 2 puffs 5 to 15 minutes prior to strenuous physical activities. Monitor frequency of use.

## 2021-03-26 NOTE — Addendum Note (Signed)
Addended by: Demetrio Lapping E on: 03/26/2021 11:51 AM   Modules accepted: Orders

## 2021-03-26 NOTE — Progress Notes (Signed)
New Patient Note  RE: Darrell Fuller MRN: [id number] DOB: 05/26/1961 Date of Office Visit: 03/26/2021  Consult requested by: No ref. provider found Primary care provider: Eartha InchBadger, Michael C, MD  Chief Complaint: Asthma and Allergies (Itchy skin at bedtime x 1 year)  History of Present Illness: I had the pleasure of seeing Darrell Fuller for initial evaluation at the [facility] on 03/26/2021. He is a 60 y.o. male, who is self-referred here for the evaluation of itching, allergies and asthma.  Itching: Itching started about 1 year ago. This can occur anywhere on his body and most of the time there is no rash associated with this. If there is a rash then it's usually on the neck. He is itching daily and worse at night. Associated symptoms include: none. Suspected triggers are unknown. Denies any fevers, chills, changes in medications, foods, personal care products or recent infections. He has tried the following therapies: montelukast with no benefit. Systemic steroids: prednisone causes irritation/mood change.  Previous work up includes: none. Previous history of rash/hives: no. Patient is up to date with the following cancer screening tests: physical exam, colonoscopy.  Rhinitis: He reports symptoms of PND, rhinorrhea, sneezing. Symptoms have been going on for 1 years. The symptoms are present all year around. Other triggers include exposure to none. Anosmia: no. Headache: no. He has used no medications for this. Sinus infections: no. Previous work up includes: allergy testing in the past and unsure of results. No prior AIT. Previous ENT evaluation: not recently - had deviated septum repaired. Previous sinus imaging: not recently. History of nasal polyps: no.  Breathing:  He reports symptoms of chest tightness, shortness of breath, wheezing for 20 years. Current medications include albuterol prn and Flovent prn which help. He reports not using aerochamber with inhalers. He  tried the following inhalers: none. Main triggers are infections, exercise. In the last month, frequency of symptoms: <1x/week. Frequency of nocturnal symptoms: 0x/month. Frequency of SABA use: <1x/week. Interference with physical activity: yes. Sleep is undisturbed. In the last 12 months, emergency room visits/urgent care visits/doctor office visits or hospitalizations due to respiratory issues: no. In the last 12 months, oral steroids courses: no. Lifetime history of hospitalization for respiratory issues: no. Prior intubations: no. History of pneumonia: no. He was evaluated by allergist in the past. Smoking exposure: no. Up to date with flu vaccine: no. Up to date with COVID-19 vaccine: no. Prior Covid-19 infection: yes in 2021.  Assessment and Plan: Darrell Fuller is a 60 y.o. male with: Pruritus Whole body pruritus for 1 year occurring on a daily basis and worse at night.  Sometimes has a rash on the neck only.  Tried Singulair with no benefit.  No specific triggers noted.  Concerned about allergic triggers. Today's skin testing showed: Negative to indoor/outdoor allergens and common foods. Not sure what's causing the itching but the most common cause in this age group is usually xerosis. See below for proper skin care - make sure to moisturize daily and use fragrance free and dye free products. Start zyrtec (cetirizine) 10mg  once a day in the morning. Take hydroxyzine 10-20mg  1 hour before bedtime as needed for itching. Avoid the following potential triggers: alcohol, tight clothing, NSAIDs.  Get bloodwork to rule out any other etiologies.  Chronic rhinitis Perennial rhinitis symptoms for 1 year with no triggers noted.  Has not tried any medications for this.  Had deviated septum repair in the past. Today skin testing was negative to indoor/outdoor  allergens. Use Atrovent (ipratropium) 0.03% 1-2 sprays per nostril twice a day as needed for runny nose/drainage. Nasal saline spray (i.e., Simply Saline)  or nasal saline lavage (i.e., NeilMed) is recommended as needed and prior to medicated nasal sprays. If no improvement with above regimen then will refer to ENT to look at sinus anatomy.  Mild intermittent asthma without complication Diagnosed with asthma 20 years ago and main triggers include exertion and infections.  Patient does not tolerate oral prednisone.  Uses albuterol and Flovent as needed with good benefit. Covid-19 in 2021 - not vaccinated.  Today's spirometry was normal. Daily controller medication(s): none. During upper respiratory infections/asthma flares: start Flovent 2 puffs twice a day and rinse mouth after each use for 1-2 weeks until your breathing symptoms return to baseline.  May use albuterol rescue inhaler 2 puffs every 4 to 6 hours as needed for shortness of breath, chest tightness, coughing, and wheezing. May use albuterol rescue inhaler 2 puffs 5 to 15 minutes prior to strenuous physical activities. Monitor frequency of use.   Return in about 2 months (around 05/26/2021).  Meds ordered this encounter  Medications   hydrOXYzine (ATARAX/VISTARIL) 10 MG tablet    Sig: Take 1-2 tablets 1 hour before bedtime as needed for itching.    Dispense:  60 tablet    Refill:  3   cetirizine (ZYRTEC ALLERGY) 10 MG tablet    Sig: Take 1 tablet (10 mg total) by mouth daily.    Dispense:  30 tablet    Refill:  3   ipratropium (ATROVENT) 0.03 % nasal spray    Sig: Place 1-2 sprays into both nostrils 2 (two) times daily as needed (nasal drainage).    Dispense:  30 mL    Refill:  5    Lab Orders  CBC with Differential/Platelet  ANA w/Reflex  Alpha-Gal Panel  C-reactive protein  Tryptase  Sedimentation rate  Thyroid Cascade Profile    Other allergy screening: Food allergy: no Medication allergy: yes Hymenoptera allergy: no History of recurrent infections suggestive of immunodeficency: no  Diagnostics: Spirometry:  Tracings reviewed. His effort: Good reproducible  efforts. FVC: 3.60L FEV1: 2.81L, 91% predicted FEV1/FVC ratio: 78% Interpretation: Spirometry consistent with normal pattern.  Please see scanned spirometry results for details.  Skin Testing: Environmental allergy panel and select foods. Negative to indoor/outdoor allergens and common foods. Results discussed with patient/family.  Airborne Adult Perc - 03/26/21 1004     Time Antigen Placed 1010    Allergen Manufacturer Greer    Location Back    Number of Test 59    Panel 1 Select    1. Control-Buffer 50% Glycerol Negative    2. Control-Histamine 1 mg/ml 2+    3. Albumin saline Negative    4. Bahia Negative    5. French Southern Territories Negative    6. Johnson Negative    7. Kentucky Blue Negative    8. Meadow Fescue Negative    9. Perennial Rye Negative    10. Sweet Vernal Negative    11. Timothy Negative    12. Cocklebur Negative    13. Burweed Marshelder Negative    14. Ragweed, short Negative    15. Ragweed, Giant Negative    16. Plantain,  English Negative    17. Lamb's Quarters Negative    18. Sheep Sorrell Negative    19. Rough Pigweed Negative    20. Marsh Elder, Rough Negative    21. Mugwort, Common Negative    22. Ash mix  Negative    23. Birch mix Negative    24. Beech American Negative    25. Box, Elder Negative    26. Cedar, red Negative    27. Cottonwood, Guinea-Bissau Negative    28. Elm mix Negative    29. Hickory Negative    30. Maple mix Negative    31. Oak, Guinea-Bissau mix Negative    32. Pecan Pollen Negative    33. Pine mix Negative    34. Sycamore Eastern Negative    35. Walnut, Black Pollen Negative    36. Alternaria alternata Negative    37. Cladosporium Herbarum Negative    38. Aspergillus mix Negative    39. Penicillium mix Negative    40. Bipolaris sorokiniana (Helminthosporium) Negative    41. Drechslera spicifera (Curvularia) Negative    42. Mucor plumbeus Negative    43. Fusarium moniliforme Negative    44. Aureobasidium pullulans (pullulara) Negative     45. Rhizopus oryzae Negative    46. Botrytis cinera Negative    47. Epicoccum nigrum Negative    48. Phoma betae Negative    49. Candida Albicans Negative    50. Trichophyton mentagrophytes Negative    51. Mite, D Farinae  5,000 AU/ml Negative    52. Mite, D Pteronyssinus  5,000 AU/ml Negative    53. Cat Hair 10,000 BAU/ml Negative    54.  Dog Epithelia Negative    55. Mixed Feathers Negative    56. Horse Epithelia Negative    57. Cockroach, German Negative    58. Mouse Negative    59. Tobacco Leaf Negative             Food Perc - 03/26/21 1005       Test Information   Time Antigen Placed 1010    Allergen Manufacturer Waynette Buttery    Location Back    Number of allergen test 10    Food Select      Food   1. Peanut Negative    2. Soybean food Negative    3. Wheat, whole Negative    4. Sesame Negative    5. Milk, cow Negative    6. Egg White, chicken Negative    7. Casein Negative    8. Shellfish mix Negative    9. Fish mix Negative    10. Cashew Negative             Intradermal - 03/26/21 1102     Time Antigen Placed 1045    Allergen Manufacturer Greer    Location Arm    Number of Test 15    Intradermal Select    Control Negative    French Southern Territories Negative    Johnson Negative    7 Grass Negative    Ragweed mix Negative    Weed mix Negative    Tree mix Negative    Mold 1 Negative    Mold 2 Negative    Mold 3 Negative    Mold 4 Negative    Cat Negative    Dog Negative    Cockroach Negative    Mite mix Negative             Past Medical History: Patient Active Problem List   Diagnosis Date Noted   Pruritus 03/26/2021   Mild intermittent asthma without complication 03/26/2021   Chronic rhinitis 03/26/2021   Palpitations 02/10/2018   Elevated BP 09/17/2013   Chest pain 01/10/2013   Dyspnea 01/10/2013   HERPES ZOSTER 08/25/2010   TREMOR  05/13/2009   COUGH 11/07/2008   ALLERGIC RHINITIS 10/28/2008   TESTOSTERONE DEFICIENCY 08/12/2008   ATTENTION  DEFICIT HYPERACTIVITY DISORDER, ADULT 08/12/2008   ASTHMATIC BRONCHITIS, ACUTE 08/12/2008   DEGENERATIVE JOINT DISEASE 08/12/2008   BACK PAIN, LUMBAR 08/12/2008   LIVER FUNCTION TESTS, ABNORMAL, HX OF 08/12/2008   Hyperlipidemia 10/02/2007   ANXIETY 10/02/2007   GERD 10/02/2007   Past Medical History:  Diagnosis Date   Abnormal liver function test    Allergic rhinitis    Anxiety    Asthmatic bronchitis    Attention deficit hyperactivity disorder    DJD (degenerative joint disease)    GERD (gastroesophageal reflux disease)    Hyperlipidemia    Lumbar back pain    Scoliosis    Testosterone deficiency    Past Surgical History: Past Surgical History:  Procedure Laterality Date   FOOT SURGERY Right    left shoulder surgery  2003   LUMBAR SPINE SURGERY  04/2009   Dr. Shelle Iron   right shoulder surgery     Dr. Amanda Pea   varicocele repair and vasectomy     Dr. Darvin Neighbours   Medication List:  Current Outpatient Medications  Medication Sig Dispense Refill   albuterol (VENTOLIN HFA) 108 (90 Base) MCG/ACT inhaler Inhale into the lungs.     AMBULATORY NON FORMULARY MEDICATION Relief Factor OTC All Natural Multivit with herbs 4 capsule twice a day     cetirizine (ZYRTEC ALLERGY) 10 MG tablet Take 1 tablet (10 mg total) by mouth daily. 30 tablet 3   Cholecalciferol 100 MCG (4000 UT) CAPS Take 1 capsule by mouth daily.     ezetimibe (ZETIA) 10 MG tablet Take 10 mg by mouth daily.     fluticasone (FLOVENT HFA) 44 MCG/ACT inhaler Inhale into the lungs as needed.     hydrOXYzine (ATARAX/VISTARIL) 10 MG tablet Take 1-2 tablets 1 hour before bedtime as needed for itching. 60 tablet 3   ipratropium (ATROVENT) 0.03 % nasal spray Place 1-2 sprays into both nostrils 2 (two) times daily as needed (nasal drainage). 30 mL 5   Omega-3 Fatty Acids (ULTRA OMEGA-3 FISH OIL) 1400 MG CAPS Take 1 tablet by mouth daily.     omeprazole (PRILOSEC) 40 MG capsule Take 40 mg by mouth daily.     REPATHA SURECLICK 140  MG/ML SOAJ ADMINISTER 1 ML UNDER THE SKIN EVERY 14 DAYS 2 mL 11   No current facility-administered medications for this visit.   Allergies: Allergies  Allergen Reactions   Hydrocodone-Acetaminophen Other (See Comments)    Other reaction(s): Other Skin crawling Skin crawling    Prednisone Other (See Comments)    Irritability and insomnia   Morphine And Related Hives   Other    Pravachol [Pravastatin] Itching   Tramadol Itching    Cannot sleep   Zocor [Simvastatin] Itching and Rash   Social History: Social History   Socioeconomic History   Marital status: Married    Spouse name: Not on file   Number of children: 2   Years of education: Not on file   Highest education level: Not on file  Occupational History   Occupation: chef    Employer: [employer]  Tobacco Use   Smoking status: Never   Smokeless tobacco: Never  Vaping Use   Vaping Use: Never used  Substance and Sexual Activity   Alcohol use: Yes    Alcohol/week: 21.0 standard drinks    Types: 21 Standard drinks or equivalent per week    Comment: 1-2 beers  a week   Drug use: No   Sexual activity: Not on file  Other Topics Concern   Not on file  Social History Narrative   6 children, 2 biological, 2 adopted, 2 step children   Social Determinants of Health   Financial Resource Strain: Not on file  Food Insecurity: Not on file  Transportation Needs: Not on file  Physical Activity: Not on file  Stress: Not on file  Social Connections: Not on file   Lives in a house which is 61 years old. Smoking: denies Occupation: Research officer, political party HistorySurveyor, minerals in the house: no Engineer, civil (consulting) in the family room: yes Carpet in the bedroom: yes Heating: gas Cooling: central Pet: yes 2 cats x 3 yrs  Family History: Family History  Problem Relation Age of Onset   Heart disease Father    Crohn's disease Sister    Colon cancer Neg Hx    Stomach cancer Neg Hx    Asthma Neg Hx    Allergic rhinitis Neg Hx     Eczema Neg Hx    Urticaria Neg Hx    Immunodeficiency Neg Hx    Angioedema Neg Hx    Review of Systems  Constitutional:  Negative for appetite change, chills, fever and unexpected weight change.  HENT:  Positive for postnasal drip and rhinorrhea. Negative for congestion.   Eyes:  Negative for itching.  Respiratory:  Negative for cough, chest tightness, shortness of breath and wheezing.   Cardiovascular:  Negative for chest pain.  Gastrointestinal:  Negative for abdominal pain.  Genitourinary:  Negative for difficulty urinating.  Skin:  Negative for rash.       itching  Allergic/Immunologic: Negative for environmental allergies and food allergies.  Neurological:  Negative for headaches.   Objective: BP 128/80   Pulse 74   Temp (!) 97.2 F (36.2 C) (Temporal)   Resp 20   SpO2 97%  There is no height or weight on file to calculate BMI. Physical Exam Vitals and nursing note reviewed.  Constitutional:      Appearance: Normal appearance. He is well-developed.  HENT:     Head: Normocephalic and atraumatic.     Right Ear: External ear normal.     Left Ear: External ear normal.     Nose: Nose normal.     Mouth/Throat:     Mouth: Mucous membranes are moist.     Pharynx: Oropharynx is clear.  Eyes:     Conjunctiva/sclera: Conjunctivae normal.  Cardiovascular:     Rate and Rhythm: Normal rate and regular rhythm.     Heart sounds: Normal heart sounds. No murmur heard.   No friction rub. No gallop.  Pulmonary:     Effort: Pulmonary effort is normal.     Breath sounds: Normal breath sounds. No wheezing, rhonchi or rales.  Abdominal:     Palpations: Abdomen is soft.  Musculoskeletal:     Cervical back: Neck supple.  Skin:    General: Skin is warm.     Findings: No rash.  Neurological:     Mental Status: He is alert and oriented to person, place, and time.  Psychiatric:        Behavior: Behavior normal.  The plan was reviewed with the patient/family, and all  questions/concerned were addressed.  It was my pleasure to see Darrell Fuller today and participate in his care. Please feel free to contact me with any questions or concerns.  Sincerely,  Wyline Mood, DO Allergy & Immunology  [facility] office: [place] office: [phone]

## 2021-03-26 NOTE — Patient Instructions (Addendum)
Today's skin testing showed: Negative to indoor/outdoor allergens and common foods. Results given.  Itching: Not sure what's causing the itching - the most common cause in your age group is usually dry skin. See below for proper skin care - make sure to moisturize daily and use fragrance free and dye free products. Start zyrtec (cetirizine) 10mg  once a day in the morning. Take hydroxyzine 10-20mg  1 hour before bedtime as needed for itching. Avoid the following potential triggers: alcohol, tight clothing, NSAIDs.  Get bloodwork:  We are ordering labs, so please allow 1-2 weeks for the results to come back. With the newly implemented Cures Act, the labs might be visible to you at the same time that they become visible to me. However, I will not address the results until all of the results are back, so please be patient.   Nasal drainage:  Use Atrovent (ipratropium) 0.03% 1-2 sprays per nostril twice a day as needed for runny nose/drainage. Nasal saline spray (i.e., Simply Saline) or nasal saline lavage (i.e., NeilMed) is recommended as needed and prior to medicated nasal sprays.  Asthma: Today's breathing test was normal.  Daily controller medication(s): none. During upper respiratory infections/asthma flares: start Flovent 2 puffs twice a day and rinse mouth after each use for 1-2 weeks until your breathing symptoms return to baseline.  May use albuterol rescue inhaler 2 puffs every 4 to 6 hours as needed for shortness of breath, chest tightness, coughing, and wheezing. May use albuterol rescue inhaler 2 puffs 5 to 15 minutes prior to strenuous physical activities. Monitor frequency of use.  Asthma control goals:  Full participation in all desired activities (may need albuterol before activity) Albuterol use two times or less a week on average (not counting use with activity) Cough interfering with sleep two times or less a month Oral steroids no more than once a year No  hospitalizations  Follow up in 2 months or sooner if needed.   Skin care recommendations  Bath time: Always use lukewarm water. AVOID very hot or cold water. Keep bathing time to 5-10 minutes. Do NOT use bubble bath. Use a mild soap and use just enough to wash the dirty areas. Do NOT scrub skin vigorously.  After bathing, pat dry your skin with a towel. Do NOT rub or scrub the skin.  Moisturizers and prescriptions:  ALWAYS apply moisturizers immediately after bathing (within 3 minutes). This helps to lock-in moisture. Use the moisturizer several times a day over the whole body. Good summer moisturizers include: Aveeno, CeraVe, Cetaphil. Good winter moisturizers include: Aquaphor, Vaseline, Cerave, Cetaphil, Eucerin, Vanicream. When using moisturizers along with medications, the moisturizer should be applied about one hour after applying the medication to prevent diluting effect of the medication or moisturize around where you applied the medications. When not using medications, the moisturizer can be continued twice daily as maintenance.  Laundry and clothing: Avoid laundry products with added color or perfumes. Use unscented hypo-allergenic laundry products such as Tide free, Cheer free & gentle, and All free and clear.  If the skin still seems dry or sensitive, you can try double-rinsing the clothes. Avoid tight or scratchy clothing such as wool. Do not use fabric softeners or dyer sheets.

## 2021-03-30 LAB — ALPHA-GAL PANEL
Allergen Lamb IgE: <0.1 kU/L
Beef IgE: <0.1 kU/L
IgE (Immunoglobulin E), Serum: 11 IU/mL (ref 6–495)
O215-IgE Alpha-Gal: <0.1 kU/L
Pork IgE: <0.1 kU/L

## 2021-03-30 LAB — CBC WITH DIFFERENTIAL/PLATELET
Basophils Absolute: 0 10*3/uL (ref 0.0–0.2)
Basos: 1 %
EOS (ABSOLUTE): 0.1 10*3/uL (ref 0.0–0.4)
Eos: 1 %
Hematocrit: 43.5 % (ref 37.5–51.0)
Hemoglobin: 14.7 g/dL (ref 13.0–17.7)
Immature Grans (Abs): 0 10*3/uL (ref 0.0–0.1)
Immature Granulocytes: 0 %
Lymphocytes Absolute: 1.6 10*3/uL (ref 0.7–3.1)
Lymphs: 29 %
MCH: 31.3 pg (ref 26.6–33.0)
MCHC: 33.8 g/dL (ref 31.5–35.7)
MCV: 93 fL (ref 79–97)
Monocytes Absolute: 0.6 10*3/uL (ref 0.1–0.9)
Monocytes: 11 %
Neutrophils Absolute: 3.2 10*3/uL (ref 1.4–7.0)
Neutrophils: 58 %
Platelets: 154 10*3/uL (ref 150–450)
RBC: 4.69 x10E6/uL (ref 4.14–5.80)
RDW: 13.1 % (ref 11.6–15.4)
WBC: 5.6 10*3/uL (ref 3.4–10.8)

## 2021-03-30 LAB — ANA W/REFLEX: Anti Nuclear Antibody (ANA): NEGATIVE

## 2021-03-30 LAB — SEDIMENTATION RATE: Sed Rate: 3 mm/hr (ref 0–30)

## 2021-03-30 LAB — C-REACTIVE PROTEIN: CRP: 2 mg/L (ref 0–10)

## 2021-03-30 LAB — TRYPTASE: Tryptase: 5.1 ug/L (ref 2.2–13.2)

## 2021-03-30 LAB — THYROID CASCADE PROFILE: TSH: 1.48 u[IU]/mL (ref 0.450–4.500)

## 2021-06-11 ENCOUNTER — Ambulatory Visit: Payer: BC Managed Care – PPO | Admitting: Allergy

## 2021-06-18 ENCOUNTER — Other Ambulatory Visit: Payer: Self-pay | Admitting: Neurological Surgery

## 2021-06-18 DIAGNOSIS — M4316 Spondylolisthesis, lumbar region: Secondary | ICD-10-CM

## 2021-07-11 ENCOUNTER — Other Ambulatory Visit: Payer: BC Managed Care – PPO

## 2021-07-11 ENCOUNTER — Ambulatory Visit
Admission: RE | Admit: 2021-07-11 | Discharge: 2021-07-11 | Disposition: A | Discharge status: Home / Self Care | Payer: BC Managed Care – PPO | Source: Ambulatory Visit | Attending: Neurological Surgery | Admitting: Neurological Surgery

## 2021-07-11 ENCOUNTER — Other Ambulatory Visit: Payer: Self-pay

## 2021-07-11 DIAGNOSIS — M4316 Spondylolisthesis, lumbar region: Secondary | ICD-10-CM

## 2021-07-11 IMAGING — MR MR LUMBAR SPINE WO/W CM
4 of 7 series · 17 of 48 positions shown · IV contrast (18ml multihance)
Comparison: [DATE] MRI lumbar spine, [DATE] lumbar spine
radiographs

CLINICAL DATA: Low back pain bilateral thigh pain and weakness

EXAM:
MRI LUMBAR SPINE WITHOUT AND WITH CONTRAST
TECHNIQUE: Multiplanar and multiecho pulse sequences of the lumbar spine were
obtained without and with intravenous contrast.
CONTRAST:  18mL MULTIHANCE GADOBENATE DIMEGLUMINE 529 MG/ML IV SOLN

[Series 5: T1 · sagittal · 4.0mm · 0.73mm/px · 3 of 15 slices shown (1 of 2)]
[im 1/15]
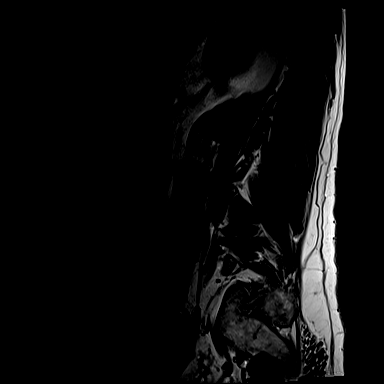
[im 8/15]
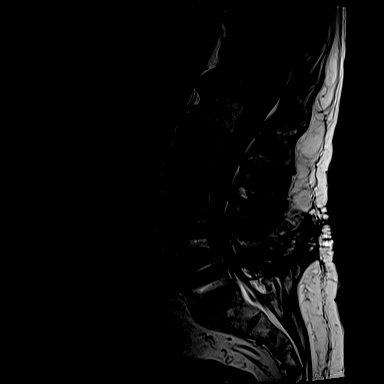
[im 15/15]
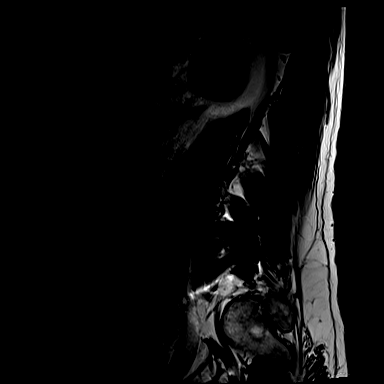

[Series 9: T1 · axial · 4.0mm · 0.28mm/px · z∈[-10,+178]mm · 3 of 39 slices shown (2 of 2)]
[im 4/39]
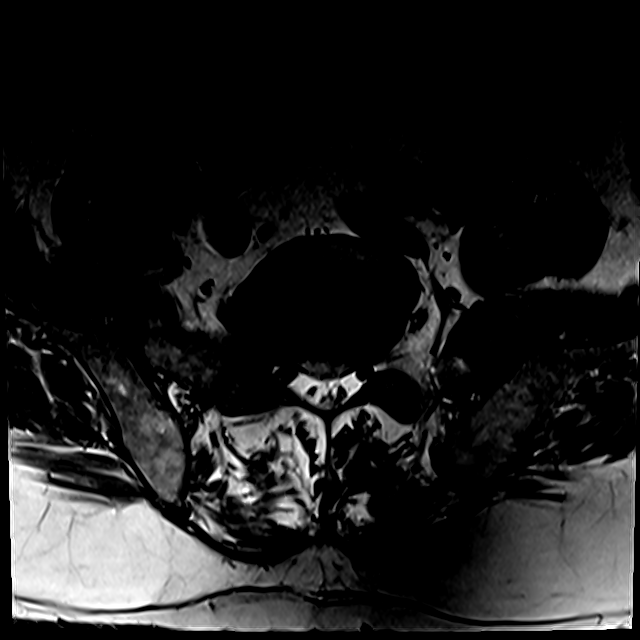
[im 20/39]
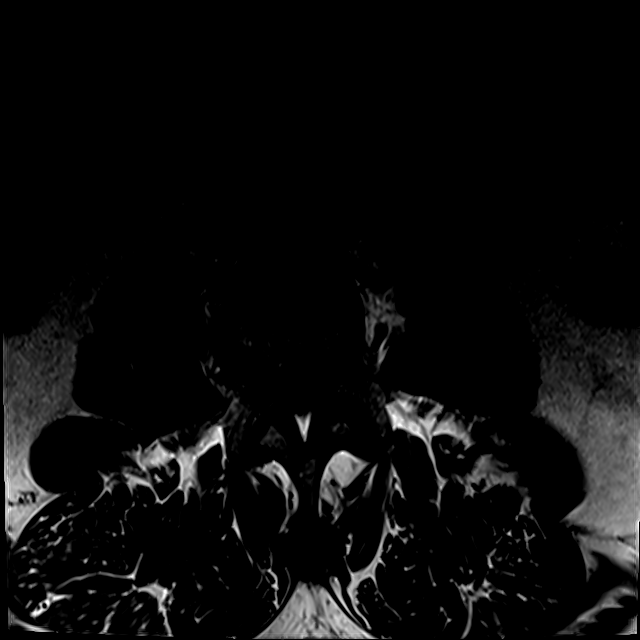
[im 35/39]
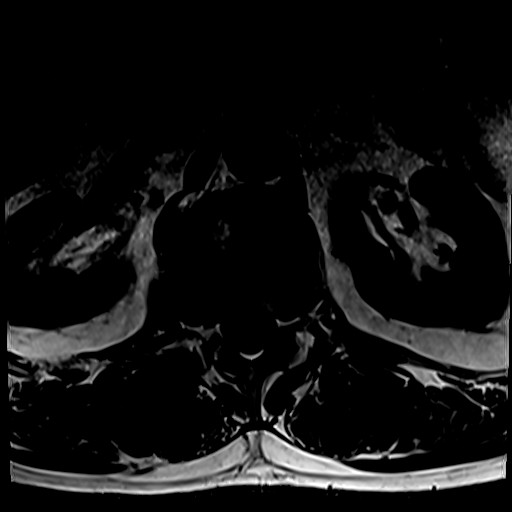

[Series 12: T2 · axial · 4.0mm · 0.28mm/px · z∈[-25,+178]mm · 8 of 39 slices shown (1 of 2)]
[im 1/39]
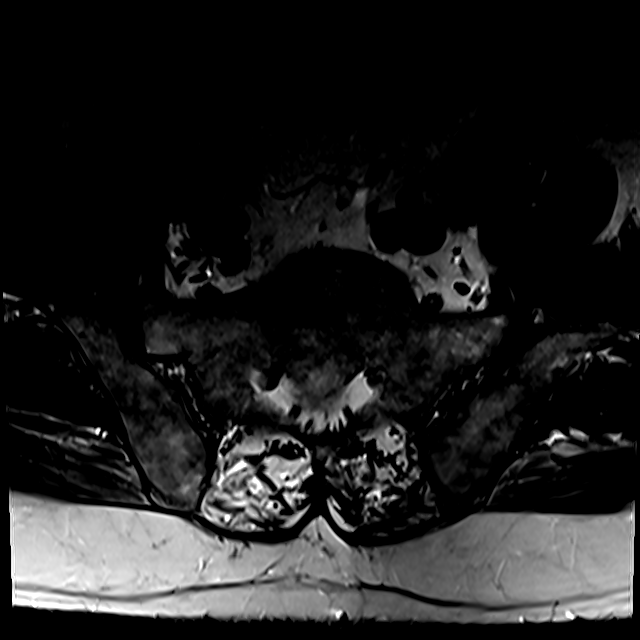
[im 4/39]
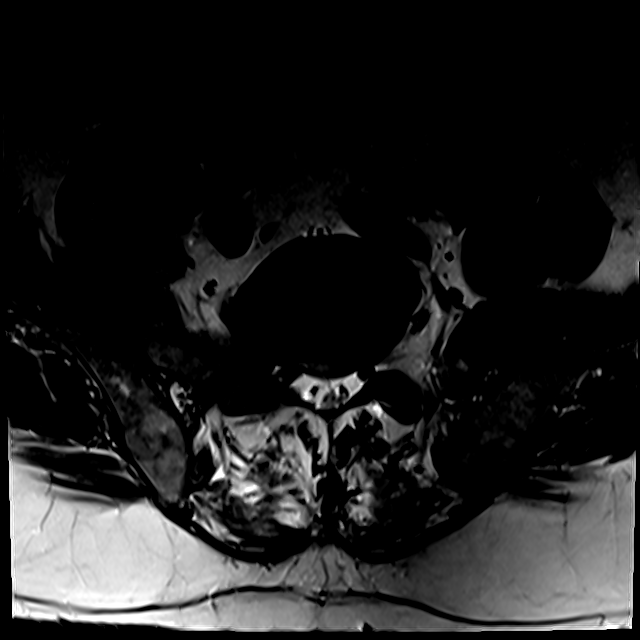
[im 8/39]
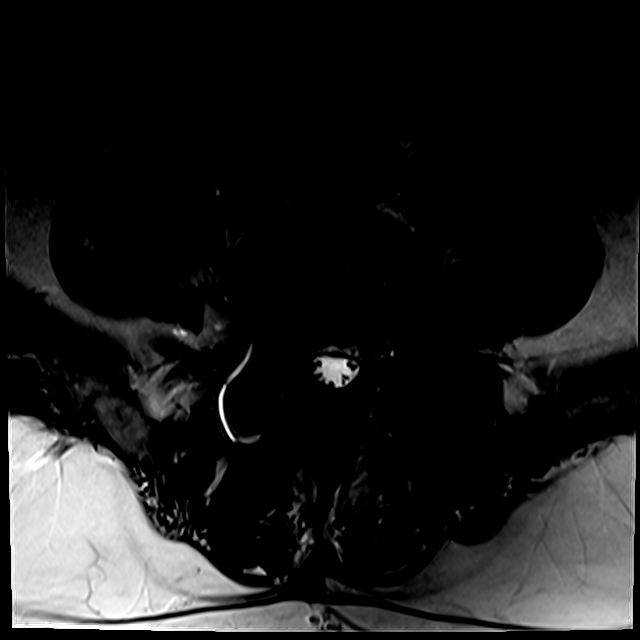
[im 12/39]
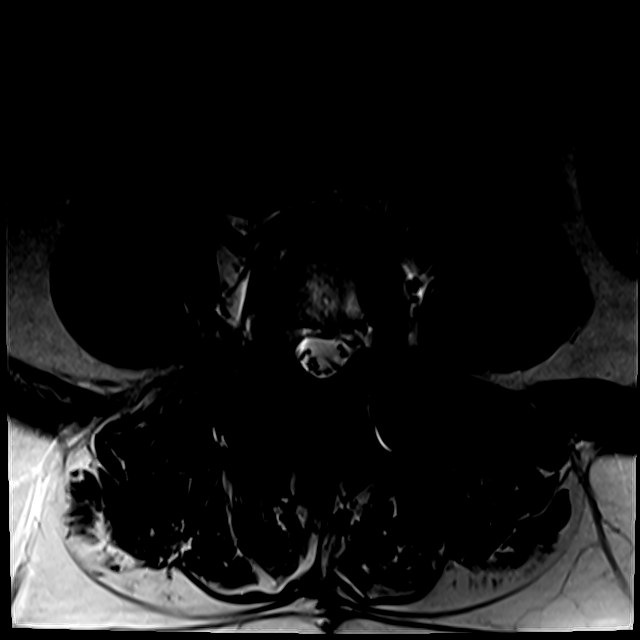
[im 16/39]
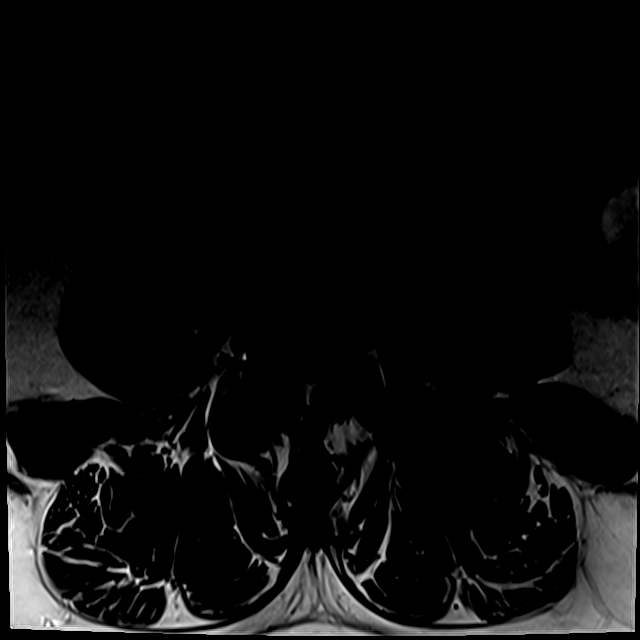
[im 20/39]
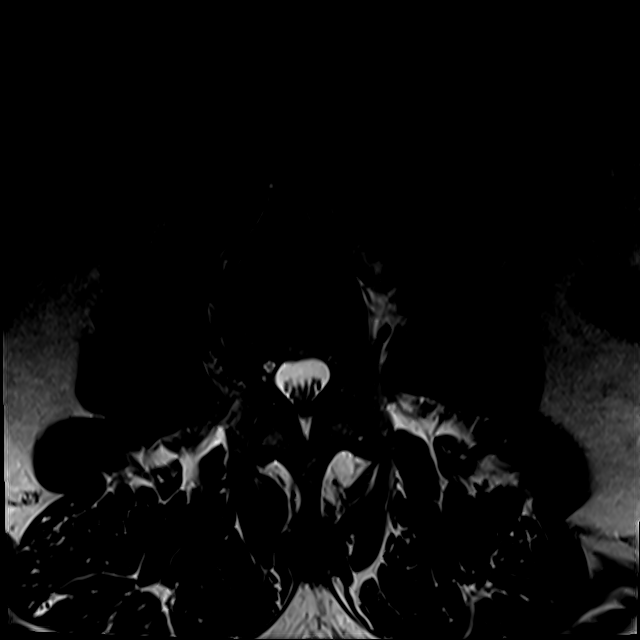
[im 23/39]
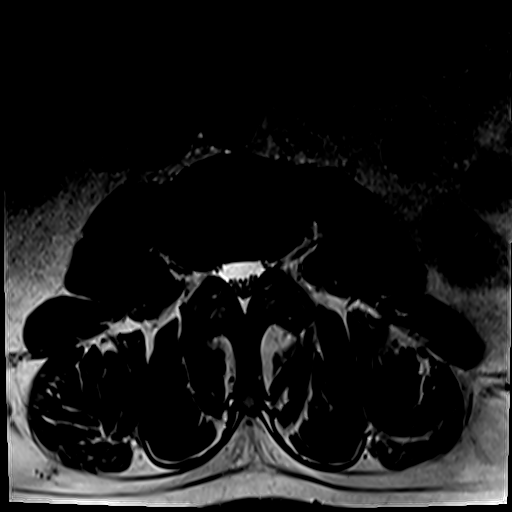
[im 35/39]
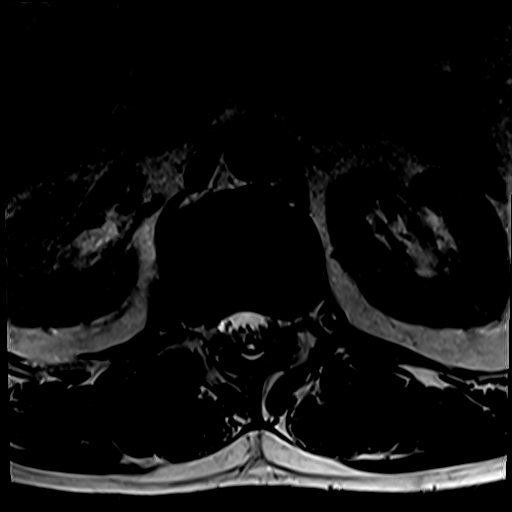

[Series 13: T2 · sagittal · 4.0mm · 0.73mm/px · 3 of 15 slices shown (2 of 2)]
[im 1/15]
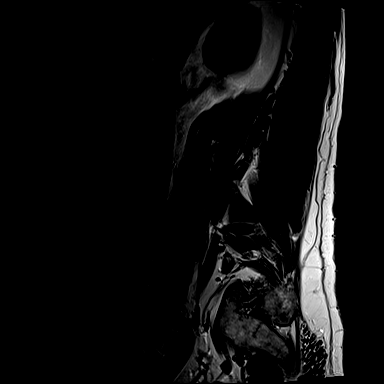
[im 10/15]
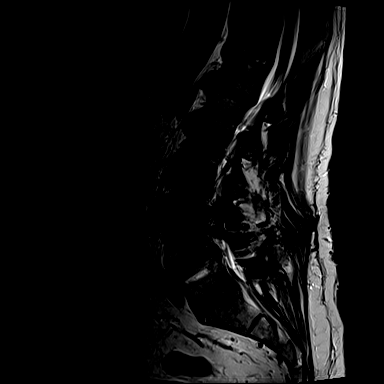
[im 15/15]
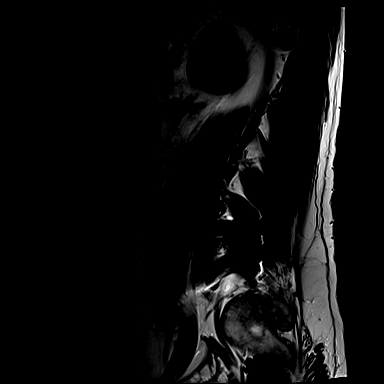

[17 of 48 positions shown; findings below may reference images not displayed]

FINDINGS: Segmentation:  Standard.

Alignment: S shaped curvature of the thoracolumbar spine. Trace
retrolisthesis T12 on L1 and L1 on L2, unchanged.

Vertebrae: No acute fracture or suspicious osseous lesion. Status
post L4-L5 posterior fusion with interbody disc spacer. No abnormal
enhancement.

Conus medullaris and cauda equina: Conus extends to the L1 level.
Conus and cauda equina appear normal. No abnormal enhancement.

Paraspinal and other soft tissues: Negative.

Disc levels:

T12-L1: Disc height loss, trace retrolisthesis, and mild disc bulge.
Mild facet arthropathy. No spinal canal stenosis or neural foraminal
narrowing.

L1-L2: Trace retrolisthesis and minimal disc bulge. Mild facet
arthropathy. No spinal canal stenosis or neural foraminal narrowing.

L2-L3: No significant disc bulge. Mild facet arthropathy. No spinal
canal stenosis or neural foraminal narrowing.

L3-L4: Disc bulge with superimposed left subarticular and foraminal
protrusion, which effaces the left lateral recess. Moderate facet
arthropathy. Ligamentum flavum hypertrophy. Moderate to severe
spinal canal stenosis. Mild left neural foraminal narrowing.

L4-L5: Status post fusion. No spinal canal stenosis. Moderate facet
arthropathy. Evaluation of the neural foramina is somewhat limited
by susceptibility artifacts, however there appears to be at least
mild left neural foraminal narrowing.

L5-S1: No significant disc bulge. Right greater than left facet
arthropathy no spinal canal stenosis or neural foraminal narrowing.
IMPRESSION: 1. Status post L4-L5 posterior fusion with interbody disc spacer.
Evaluation of the neural foramina is limited by susceptibility
artifact however there appears to be at least mild left neural
foraminal narrowing at this level. No spinal canal stenosis.
2. Adjacent segment disease at L3-L4, where there is moderate to
severe spinal canal stenosis and effacement of the left lateral
recess, which likely compresses the descending left L4 nerve. In
addition there is mild left neural foraminal narrowing.

## 2021-07-11 MED ORDER — GADOBENATE DIMEGLUMINE 529 MG/ML IV SOLN
18.0000 mL | Freq: Once | INTRAVENOUS | Status: AC | PRN
  Administered 2021-07-11: 18 mL via INTRAVENOUS

## 2021-08-05 ENCOUNTER — Other Ambulatory Visit: Payer: Self-pay | Admitting: Neurological Surgery

## 2021-08-26 NOTE — Progress Notes (Signed)
Surgical Instructions   Your procedure is scheduled on Monday 08/31/2021.  Report to [facility] Main Entrance "A" at 11:00 A.M., then check in with the Admitting office.  Call [phone] if you have problems or questions between now and the morning of surgery:   Remember: Do not eat after midnight the night before your surgery  You may drink clear liquids until 10:00 a.m.  the morning of your surgery.   Clear liquids allowed are: Water, Non-Citrus Juices (without pulp), Carbonated Beverages, Clear Tea, Black Coffee Only (NO MILK, CREAM, or POWDERED CREAMER of any kind), and Gatorade   Take these medicines the morning of surgery with A SIP OF WATER:  Ezetimibe (Zetia)   If needed you may take these medications the morning of surgery: Albuterol (Ventolin) Inhaler - Please bring all inhalers with you the day of surgery. Ipratropium (Atrovent) nasal spray Montelukast (Singulair) Omeprazole (Prilosec) Oxycodone-acetaminophen (Percocet/Roxicet)   As of today, STOP taking any Aspirin (unless otherwise instructed by your surgeon) or Aspirin-containing products; NSAIDS - Aleve, Naproxen, Ibuprofen, Motrin, Advil, Goody's, BC's, all herbal medications, fish oil, and all vitamins.   After your pre-procedure COVID test You are not required to quarantine however you are required to wear a well-fitting mask when you are out and around people not in your household.  If your mask becomes wet or soiled, replace with a new one.  Wash your hands often with soap and water for 20 seconds or clean your hands with an alcohol-based hand sanitizer that contains at least 60% alcohol.  Do not share personal items.  Notify your provider: if you are in close contact with someone who has COVID  or if you develop a fever of 100.4 or greater, sneezing, cough, sore throat, shortness of breath or body aches.          Do not wear jewelry  Do not wear lotions, powders, colognes, or deodorant.  Do not shave  48 hours prior to surgery.  Men may shave face and neck.  Do not bring valuables to the hospital - [facility] is not responsible for any belongings or valuables.  Do NOT Smoke (Tobacco/Vaping) or drink Alcohol 24 hours prior to your procedure  If you use a CPAP at night, please bring your mask for your overnight stay.   Contacts, glasses, hearing aids, dentures or partials may not be worn into surgery, please bring cases for these belongings   For patients admitted to the hospital, discharge time will be determined by your treatment team.   Patients discharged the day of surgery will not be allowed to drive home, and someone needs to stay with them for 24 hours.  NO VISITORS WILL BE ALLOWED IN PRE-OP WHERE PATIENTS ARE PREPPED FOR SURGERY.  ONLY 1 SUPPORT PERSON MAY BE PRESENT IN THE WAITING ROOM WHILE YOU ARE IN SURGERY.  IF YOU ARE TO BE ADMITTED, ONCE YOU ARE IN YOUR ROOM YOU WILL BE ALLOWED TWO (2) VISITORS. 1 (ONE) VISITOR MAY STAY OVERNIGHT BUT MUST ARRIVE TO THE ROOM BY 8pm.  Minor children may have two parents present. Special consideration for safety and communication needs will be reviewed on a case by case basis.  Special instructions:    Oral Hygiene is also important to reduce your risk of infection.  Remember - BRUSH YOUR TEETH THE MORNING OF SURGERY WITH YOUR REGULAR TOOTHPASTE   [facility]- Preparing For Surgery  Before surgery, you can play an important role. Because skin is not sterile, your skin  needs to be as free of germs as possible. You can reduce the number of germs on your skin by washing with CHG (chlorahexidine gluconate) Soap before surgery.  CHG is an antiseptic cleaner which kills germs and bonds with the skin to continue killing germs even after washing.     Please do not use if you have an allergy to CHG or antibacterial soaps. If your skin becomes reddened/irritated stop using the CHG.  Do not shave (including legs and underarms) for at least 48 hours  prior to first CHG shower. It is OK to shave your face.  Please follow these instructions carefully.     Shower the NIGHT BEFORE SURGERY and the MORNING OF SURGERY with CHG Soap.   If you chose to wash your hair, wash your hair first as usual with your normal shampoo. After you shampoo, rinse your hair and body thoroughly to remove the shampoo.    Then [employer] and genitals (private parts) with your normal soap and rinse thoroughly to remove soap.  Next use the CHG Soap as you would any other liquid soap. You can apply CHG directly to the skin and wash gently with a clean washcloth.   Apply the CHG Soap to your body ONLY FROM THE NECK DOWN.  Do not use on open wounds or open sores. Avoid contact with your eyes, ears, mouth and genitals (private parts). Wash Face and genitals (private parts)  with your normal soap.   Wash thoroughly, paying special attention to the area where your surgery will be performed.  Thoroughly rinse your body with warm water from the neck down.  DO NOT shower/wash with your normal soap after using and rinsing off the CHG Soap.  Pat yourself dry with a CLEAN TOWEL.  Wear CLEAN PAJAMAS to bed the night before surgery  Place CLEAN SHEETS on your bed the night before your surgery  DO NOT SLEEP WITH PETS.   Day of Surgery:  Take a shower with CHG soap. Wear Clean/Comfortable clothing the morning of surgery Do not apply any deodorants/lotions.   Remember to brush your teeth WITH YOUR REGULAR TOOTHPASTE.   Please read over the fact sheets that you were given.

## 2021-08-27 ENCOUNTER — Encounter (HOSPITAL_COMMUNITY): Payer: Self-pay

## 2021-08-27 ENCOUNTER — Other Ambulatory Visit: Payer: Self-pay

## 2021-08-27 ENCOUNTER — Encounter (HOSPITAL_COMMUNITY)
Admission: RE | Admit: 2021-08-27 | Discharge: 2021-08-27 | Disposition: A | Discharge status: Home / Self Care | Payer: BC Managed Care – PPO | Source: Ambulatory Visit | Attending: Neurological Surgery | Admitting: Neurological Surgery

## 2021-08-27 VITALS — BP 154/79 | HR 60 | Temp 98.4°F | Resp 17 | Ht 66.0 in | Wt 189.1 lb

## 2021-08-27 DIAGNOSIS — Z01818 Encounter for other preprocedural examination: Secondary | ICD-10-CM

## 2021-08-27 DIAGNOSIS — Z20822 Contact with and (suspected) exposure to covid-19: Secondary | ICD-10-CM | POA: Diagnosis not present

## 2021-08-27 DIAGNOSIS — Z01812 Encounter for preprocedural laboratory examination: Secondary | ICD-10-CM | POA: Insufficient documentation

## 2021-08-27 LAB — TYPE AND SCREEN
ABO/RH(D): O POS
Antibody Screen: NEGATIVE

## 2021-08-27 LAB — CBC
HCT: 46.3 % (ref 39.0–52.0)
Hemoglobin: 15.5 g/dL (ref 13.0–17.0)
MCH: 31.6 pg (ref 26.0–34.0)
MCHC: 33.5 g/dL (ref 30.0–36.0)
MCV: 94.3 fL (ref 80.0–100.0)
Platelets: 176 10*3/uL (ref 150–400)
RBC: 4.91 MIL/uL (ref 4.22–5.81)
RDW: 12.2 % (ref 11.5–15.5)
WBC: 5.6 10*3/uL (ref 4.0–10.5)
nRBC: 0 % (ref 0.0–0.2)

## 2021-08-27 LAB — SARS CORONAVIRUS 2 (TAT 6-24 HRS): SARS Coronavirus 2: NEGATIVE

## 2021-08-27 LAB — SURGICAL PCR SCREEN
MRSA, PCR: NEGATIVE
Staphylococcus aureus: NEGATIVE

## 2021-08-27 NOTE — Progress Notes (Signed)
PCP - Antony Haste Cardiologist - denies  Chest x-ray - 02/18/21 EKG - n/a Stress Test - 2019 ECHO - 2019   ERAS Protcol - yes, no drink ordered or given   COVID TEST- 08/27/21 (outpatient in bed)   Anesthesia review: n/a  Patient denies shortness of breath, fever, cough and chest pain at PAT appointment   All instructions explained to the patient, with a verbal understanding of the material. Patient agrees to go over the instructions while at home for a better understanding. Patient also instructed to self quarantine after being tested for COVID-19. The opportunity to ask questions was provided.

## 2021-08-31 ENCOUNTER — Inpatient Hospital Stay (HOSPITAL_COMMUNITY): Payer: BC Managed Care – PPO

## 2021-08-31 ENCOUNTER — Inpatient Hospital Stay (HOSPITAL_COMMUNITY): Payer: BC Managed Care – PPO | Admitting: Certified Registered Nurse Anesthetist

## 2021-08-31 ENCOUNTER — Encounter (HOSPITAL_COMMUNITY): Payer: Self-pay | Admitting: Neurological Surgery

## 2021-08-31 ENCOUNTER — Other Ambulatory Visit: Payer: Self-pay

## 2021-08-31 ENCOUNTER — Inpatient Hospital Stay (HOSPITAL_COMMUNITY)
Admission: RE | Admit: 2021-08-31 | Discharge: 2021-09-01 | DRG: 455 | Disposition: A | Discharge status: Home / Self Care | Payer: BC Managed Care – PPO | Attending: Neurological Surgery | Admitting: Neurological Surgery

## 2021-08-31 ENCOUNTER — Inpatient Hospital Stay (HOSPITAL_COMMUNITY): Payer: BC Managed Care – PPO | Admitting: Vascular Surgery

## 2021-08-31 ENCOUNTER — Encounter (HOSPITAL_COMMUNITY)
Admission: RE | Disposition: A | Discharge status: Home / Self Care | Payer: Self-pay | Source: Home / Self Care | Attending: Neurological Surgery

## 2021-08-31 ENCOUNTER — Other Ambulatory Visit: Payer: Self-pay | Admitting: Neurological Surgery

## 2021-08-31 DIAGNOSIS — M5116 Intervertebral disc disorders with radiculopathy, lumbar region: Secondary | ICD-10-CM | POA: Diagnosis present

## 2021-08-31 DIAGNOSIS — Z885 Allergy status to narcotic agent status: Secondary | ICD-10-CM

## 2021-08-31 DIAGNOSIS — M48062 Spinal stenosis, lumbar region with neurogenic claudication: Principal | ICD-10-CM | POA: Diagnosis present

## 2021-08-31 DIAGNOSIS — Z888 Allergy status to other drugs, medicaments and biological substances status: Secondary | ICD-10-CM

## 2021-08-31 DIAGNOSIS — E785 Hyperlipidemia, unspecified: Secondary | ICD-10-CM | POA: Diagnosis present

## 2021-08-31 DIAGNOSIS — Z981 Arthrodesis status: Secondary | ICD-10-CM | POA: Diagnosis not present

## 2021-08-31 DIAGNOSIS — M4726 Other spondylosis with radiculopathy, lumbar region: Secondary | ICD-10-CM | POA: Diagnosis present

## 2021-08-31 DIAGNOSIS — Z419 Encounter for procedure for purposes other than remedying health state, unspecified: Secondary | ICD-10-CM

## 2021-08-31 DIAGNOSIS — Z8249 Family history of ischemic heart disease and other diseases of the circulatory system: Secondary | ICD-10-CM

## 2021-08-31 LAB — ABO/RH: ABO/RH(D): O POS

## 2021-08-31 SURGERY — POSTERIOR LUMBAR FUSION 1 LEVEL
Anesthesia: General | Site: Back

## 2021-08-31 MED ORDER — EZETIMIBE 10 MG PO TABS
10.0000 mg | ORAL_TABLET | Freq: Every day | ORAL | Status: DC
  Filled 2021-08-31 (×2): qty 1

## 2021-08-31 MED ORDER — PROPOFOL 10 MG/ML IV BOLUS
INTRAVENOUS | Status: DC | PRN
  Administered 2021-08-31: 150 mg via INTRAVENOUS

## 2021-08-31 MED ORDER — ACETAMINOPHEN 325 MG PO TABS: 650.0000 mg | ORAL_TABLET | ORAL | Status: DC | PRN

## 2021-08-31 MED ORDER — CHLORHEXIDINE GLUCONATE 0.12 % MT SOLN
15.0000 mL | Freq: Once | OROMUCOSAL | Status: AC
  Administered 2021-08-31: 15 mL via OROMUCOSAL
  Filled 2021-08-31: qty 15

## 2021-08-31 MED ORDER — FENTANYL CITRATE (PF) 100 MCG/2ML IJ SOLN: 25.0000 ug | INTRAMUSCULAR | Status: DC | PRN

## 2021-08-31 MED ORDER — MONTELUKAST SODIUM 10 MG PO TABS: 10.0000 mg | ORAL_TABLET | Freq: Every day | ORAL | Status: DC | PRN

## 2021-08-31 MED ORDER — PHENYLEPHRINE HCL-NACL 20-0.9 MG/250ML-% IV SOLN
INTRAVENOUS | Status: DC | PRN
  Administered 2021-08-31: 25 ug/min via INTRAVENOUS

## 2021-08-31 MED ORDER — LACTATED RINGERS IV SOLN: INTRAVENOUS | Status: DC

## 2021-08-31 MED ORDER — SENNA 8.6 MG PO TABS
1.0000 | ORAL_TABLET | Freq: Two times a day (BID) | ORAL | Status: DC
  Administered 2021-08-31 – 2021-09-01 (×2): 8.6 mg via ORAL
  Filled 2021-08-31 (×2): qty 1

## 2021-08-31 MED ORDER — ACETAMINOPHEN 650 MG RE SUPP: 650.0000 mg | RECTAL | Status: DC | PRN

## 2021-08-31 MED ORDER — ACETAMINOPHEN 500 MG PO TABS
1000.0000 mg | ORAL_TABLET | Freq: Once | ORAL | Status: AC
  Administered 2021-08-31: 1000 mg via ORAL
  Filled 2021-08-31: qty 2

## 2021-08-31 MED ORDER — THROMBIN 5000 UNITS EX SOLR
OROMUCOSAL | Status: DC | PRN
  Administered 2021-08-31: 5 mL via TOPICAL

## 2021-08-31 MED ORDER — ONDANSETRON HCL 4 MG PO TABS: 4.0000 mg | ORAL_TABLET | Freq: Four times a day (QID) | ORAL | Status: DC | PRN

## 2021-08-31 MED ORDER — LIDOCAINE-EPINEPHRINE 1 %-1:100000 IJ SOLN
INTRAMUSCULAR | Status: DC | PRN
  Administered 2021-08-31: 5 mL

## 2021-08-31 MED ORDER — CEFAZOLIN SODIUM-DEXTROSE 2-4 GM/100ML-% IV SOLN
2.0000 g | Freq: Three times a day (TID) | INTRAVENOUS | Status: AC
  Administered 2021-08-31 – 2021-09-01 (×2): 2 g via INTRAVENOUS
  Filled 2021-08-31 (×2): qty 100

## 2021-08-31 MED ORDER — ALUM & MAG HYDROXIDE-SIMETH 200-200-20 MG/5ML PO SUSP: 30.0000 mL | Freq: Four times a day (QID) | ORAL | Status: DC | PRN

## 2021-08-31 MED ORDER — ROCURONIUM BROMIDE 10 MG/ML (PF) SYRINGE
PREFILLED_SYRINGE | INTRAVENOUS | Status: AC
  Filled 2021-08-31: qty 10

## 2021-08-31 MED ORDER — PHENYLEPHRINE 40 MCG/ML (10ML) SYRINGE FOR IV PUSH (FOR BLOOD PRESSURE SUPPORT)
PREFILLED_SYRINGE | INTRAVENOUS | Status: AC
  Filled 2021-08-31: qty 10

## 2021-08-31 MED ORDER — FENTANYL CITRATE (PF) 250 MCG/5ML IJ SOLN
INTRAMUSCULAR | Status: DC | PRN
Start: 2021-08-31 — End: 2021-08-31
  Administered 2021-08-31 (×2): 50 ug via INTRAVENOUS
  Administered 2021-08-31: 100 ug via INTRAVENOUS
  Administered 2021-08-31: 50 ug via INTRAVENOUS

## 2021-08-31 MED ORDER — PHENYLEPHRINE 40 MCG/ML (10ML) SYRINGE FOR IV PUSH (FOR BLOOD PRESSURE SUPPORT)
PREFILLED_SYRINGE | INTRAVENOUS | Status: AC
  Filled 2021-08-31: qty 20

## 2021-08-31 MED ORDER — ALBUTEROL SULFATE (2.5 MG/3ML) 0.083% IN NEBU: 2.5000 mg | INHALATION_SOLUTION | Freq: Four times a day (QID) | RESPIRATORY_TRACT | Status: DC | PRN

## 2021-08-31 MED ORDER — EPHEDRINE 5 MG/ML INJ
INTRAVENOUS | Status: AC
  Filled 2021-08-31: qty 5

## 2021-08-31 MED ORDER — FLEET ENEMA 7-19 GM/118ML RE ENEM: 1.0000 | ENEMA | Freq: Once | RECTAL | Status: DC | PRN

## 2021-08-31 MED ORDER — MIDAZOLAM HCL 2 MG/2ML IJ SOLN
INTRAMUSCULAR | Status: DC | PRN
  Administered 2021-08-31: 2 mg via INTRAVENOUS

## 2021-08-31 MED ORDER — POLYETHYLENE GLYCOL 3350 17 G PO PACK: 17.0000 g | PACK | Freq: Every day | ORAL | Status: DC | PRN

## 2021-08-31 MED ORDER — LIDOCAINE 2% (20 MG/ML) 5 ML SYRINGE
INTRAMUSCULAR | Status: AC
  Filled 2021-08-31: qty 5

## 2021-08-31 MED ORDER — SODIUM CHLORIDE 0.9 % IV SOLN: 250.0000 mL | INTRAVENOUS | Status: DC

## 2021-08-31 MED ORDER — IPRATROPIUM BROMIDE 0.06 % NA SOLN
1.0000 | Freq: Two times a day (BID) | NASAL | Status: DC | PRN
  Filled 2021-08-31: qty 15

## 2021-08-31 MED ORDER — ONDANSETRON HCL 4 MG/2ML IJ SOLN: 4.0000 mg | Freq: Four times a day (QID) | INTRAMUSCULAR | Status: DC | PRN

## 2021-08-31 MED ORDER — 0.9 % SODIUM CHLORIDE (POUR BTL) OPTIME
TOPICAL | Status: DC | PRN
  Administered 2021-08-31: 1000 mL

## 2021-08-31 MED ORDER — ROCURONIUM BROMIDE 10 MG/ML (PF) SYRINGE
PREFILLED_SYRINGE | INTRAVENOUS | Status: DC | PRN
  Administered 2021-08-31: 20 mg via INTRAVENOUS
  Administered 2021-08-31: 30 mg via INTRAVENOUS
  Administered 2021-08-31 (×2): 20 mg via INTRAVENOUS
  Administered 2021-08-31: 80 mg via INTRAVENOUS

## 2021-08-31 MED ORDER — DIPHENHYDRAMINE HCL 50 MG/ML IJ SOLN
INTRAMUSCULAR | Status: AC
  Filled 2021-08-31: qty 1

## 2021-08-31 MED ORDER — CHLORHEXIDINE GLUCONATE CLOTH 2 % EX PADS: 6.0000 | MEDICATED_PAD | Freq: Once | CUTANEOUS | Status: DC

## 2021-08-31 MED ORDER — PANTOPRAZOLE SODIUM 40 MG PO TBEC
80.0000 mg | DELAYED_RELEASE_TABLET | Freq: Every day | ORAL | Status: DC
  Administered 2021-09-01: 80 mg via ORAL
  Filled 2021-08-31: qty 2

## 2021-08-31 MED ORDER — HYDROMORPHONE HCL 1 MG/ML IJ SOLN: 0.5000 mg | INTRAMUSCULAR | Status: DC | PRN

## 2021-08-31 MED ORDER — DOCUSATE SODIUM 100 MG PO CAPS
100.0000 mg | ORAL_CAPSULE | Freq: Two times a day (BID) | ORAL | Status: DC
  Administered 2021-08-31 – 2021-09-01 (×2): 100 mg via ORAL
  Filled 2021-08-31 (×2): qty 1

## 2021-08-31 MED ORDER — SODIUM CHLORIDE 0.9% FLUSH: 3.0000 mL | INTRAVENOUS | Status: DC | PRN

## 2021-08-31 MED ORDER — ONDANSETRON HCL 4 MG/2ML IJ SOLN
INTRAMUSCULAR | Status: AC
  Filled 2021-08-31: qty 2

## 2021-08-31 MED ORDER — ALBUTEROL SULFATE HFA 108 (90 BASE) MCG/ACT IN AERS: 2.0000 | INHALATION_SPRAY | Freq: Four times a day (QID) | RESPIRATORY_TRACT | Status: DC | PRN

## 2021-08-31 MED ORDER — DIPHENHYDRAMINE HCL 50 MG/ML IJ SOLN
INTRAMUSCULAR | Status: DC | PRN
  Administered 2021-08-31: 12.5 mg via INTRAVENOUS

## 2021-08-31 MED ORDER — IPRATROPIUM BROMIDE 0.03 % NA SOLN: 1.0000 | Freq: Two times a day (BID) | NASAL | Status: DC | PRN

## 2021-08-31 MED ORDER — MIDAZOLAM HCL 2 MG/2ML IJ SOLN
INTRAMUSCULAR | Status: AC
  Filled 2021-08-31: qty 2

## 2021-08-31 MED ORDER — SODIUM CHLORIDE 0.9% FLUSH: 3.0000 mL | Freq: Two times a day (BID) | INTRAVENOUS | Status: DC

## 2021-08-31 MED ORDER — CEFAZOLIN SODIUM-DEXTROSE 2-4 GM/100ML-% IV SOLN
2.0000 g | INTRAVENOUS | Status: AC
  Administered 2021-08-31: 2 g via INTRAVENOUS
  Filled 2021-08-31: qty 100

## 2021-08-31 MED ORDER — BISACODYL 10 MG RE SUPP: 10.0000 mg | Freq: Every day | RECTAL | Status: DC | PRN

## 2021-08-31 MED ORDER — ONDANSETRON HCL 4 MG/2ML IJ SOLN
INTRAMUSCULAR | Status: DC | PRN
  Administered 2021-08-31: 4 mg via INTRAVENOUS

## 2021-08-31 MED ORDER — LORATADINE 10 MG PO TABS
10.0000 mg | ORAL_TABLET | Freq: Every day | ORAL | Status: DC
  Filled 2021-08-31 (×2): qty 1

## 2021-08-31 MED ORDER — METHOCARBAMOL 1000 MG/10ML IJ SOLN
500.0000 mg | Freq: Four times a day (QID) | INTRAVENOUS | Status: DC | PRN
  Filled 2021-08-31: qty 5

## 2021-08-31 MED ORDER — THROMBIN 5000 UNITS EX SOLR
CUTANEOUS | Status: AC
  Filled 2021-08-31: qty 5000

## 2021-08-31 MED ORDER — LIDOCAINE 2% (20 MG/ML) 5 ML SYRINGE
INTRAMUSCULAR | Status: DC | PRN
  Administered 2021-08-31: 60 mg via INTRAVENOUS

## 2021-08-31 MED ORDER — PROPOFOL 10 MG/ML IV BOLUS
INTRAVENOUS | Status: AC
  Filled 2021-08-31: qty 20

## 2021-08-31 MED ORDER — VITAMIN D 25 MCG (1000 UNIT) PO TABS
5000.0000 [IU] | ORAL_TABLET | Freq: Every day | ORAL | Status: DC
  Filled 2021-08-31 (×2): qty 5

## 2021-08-31 MED ORDER — SUGAMMADEX SODIUM 200 MG/2ML IV SOLN
INTRAVENOUS | Status: DC | PRN
  Administered 2021-08-31: 200 mg via INTRAVENOUS

## 2021-08-31 MED ORDER — MENTHOL 3 MG MT LOZG: 1.0000 | LOZENGE | OROMUCOSAL | Status: DC | PRN

## 2021-08-31 MED ORDER — ORAL CARE MOUTH RINSE: 15.0000 mL | Freq: Once | OROMUCOSAL | Status: AC

## 2021-08-31 MED ORDER — FENTANYL CITRATE (PF) 250 MCG/5ML IJ SOLN
INTRAMUSCULAR | Status: AC
  Filled 2021-08-31: qty 5

## 2021-08-31 MED ORDER — DEXAMETHASONE SODIUM PHOSPHATE 10 MG/ML IJ SOLN
INTRAMUSCULAR | Status: DC | PRN
  Administered 2021-08-31: 5 mg via INTRAVENOUS

## 2021-08-31 MED ORDER — METHOCARBAMOL 500 MG PO TABS
500.0000 mg | ORAL_TABLET | Freq: Four times a day (QID) | ORAL | Status: DC | PRN
  Administered 2021-08-31 – 2021-09-01 (×2): 500 mg via ORAL
  Filled 2021-08-31 (×2): qty 1

## 2021-08-31 MED ORDER — THROMBIN 20000 UNITS EX SOLR
CUTANEOUS | Status: AC
  Filled 2021-08-31: qty 20000

## 2021-08-31 MED ORDER — OXYCODONE-ACETAMINOPHEN 5-325 MG PO TABS
1.0000 | ORAL_TABLET | ORAL | Status: DC | PRN
  Administered 2021-08-31 – 2021-09-01 (×4): 2 via ORAL
  Filled 2021-08-31 (×4): qty 2

## 2021-08-31 MED ORDER — BUPIVACAINE HCL (PF) 0.5 % IJ SOLN
INTRAMUSCULAR | Status: AC
  Filled 2021-08-31: qty 30

## 2021-08-31 MED ORDER — BUPIVACAINE HCL (PF) 0.5 % IJ SOLN
INTRAMUSCULAR | Status: DC | PRN
  Administered 2021-08-31: 5 mL
  Administered 2021-08-31: 25 mL

## 2021-08-31 MED ORDER — PHENOL 1.4 % MT LIQD: 1.0000 | OROMUCOSAL | Status: DC | PRN

## 2021-08-31 MED ORDER — LIDOCAINE-EPINEPHRINE 1 %-1:100000 IJ SOLN
INTRAMUSCULAR | Status: AC
  Filled 2021-08-31: qty 1

## 2021-08-31 SURGICAL SUPPLY — 68 items
ADH SKN CLS APL DERMABOND .7 (GAUZE/BANDAGES/DRESSINGS) ×1
APL SRG 60D 8 XTD TIP BNDBL (TIP)
BAG COUNTER SPONGE SURGICOUNT (BAG) ×3 IMPLANT
BAG SPNG CNTER NS LX DISP (BAG) ×1
BASKET BONE COLLECTION (BASKET) ×3 IMPLANT
BLADE BN FN 3.2XSTRL LF (MISCELLANEOUS) IMPLANT
BLADE BONE MILL FINE (MISCELLANEOUS) ×2
BLADE CLIPPER SURG (BLADE) IMPLANT
BONE CANC CHIPS 20CC PCAN1/4 (Bone Implant) ×2 IMPLANT
BUR MATCHSTICK NEURO 3.0 LAGG (BURR) ×3 IMPLANT
CAGE COROENT PLIF 10X28-8 LUMB (Cage) ×2 IMPLANT
CANISTER SUCT 3000ML PPV (MISCELLANEOUS) ×3 IMPLANT
CHIPS CANC BONE 20CC PCAN1/4 (Bone Implant) ×1 IMPLANT
CNTNR URN SCR LID CUP LEK RST (MISCELLANEOUS) ×2 IMPLANT
CONT SPEC 4OZ STRL OR WHT (MISCELLANEOUS) ×2
COVER BACK TABLE 60X90IN (DRAPES) ×3 IMPLANT
DECANTER SPIKE VIAL GLASS SM (MISCELLANEOUS) ×3 IMPLANT
DERMABOND ADVANCED (GAUZE/BANDAGES/DRESSINGS) ×1
DERMABOND ADVANCED .7 DNX12 (GAUZE/BANDAGES/DRESSINGS) ×2 IMPLANT
DEVICE DISSECT PLASMABLAD 3.0S (MISCELLANEOUS) ×2 IMPLANT
DRAPE C-ARM 42X72 X-RAY (DRAPES) ×6 IMPLANT
DRAPE HALF SHEET 40X57 (DRAPES) IMPLANT
DRAPE LAPAROTOMY 100X72X124 (DRAPES) ×3 IMPLANT
DRSG OPSITE POSTOP 4X6 (GAUZE/BANDAGES/DRESSINGS) ×1 IMPLANT
DURAPREP 26ML APPLICATOR (WOUND CARE) ×3 IMPLANT
DURASEAL APPLICATOR TIP (TIP) IMPLANT
DURASEAL SPINE SEALANT 3ML (MISCELLANEOUS) IMPLANT
ELECT REM PT RETURN 9FT ADLT (ELECTROSURGICAL) ×2
ELECTRODE REM PT RTRN 9FT ADLT (ELECTROSURGICAL) ×2 IMPLANT
GAUZE 4X4 16PLY ~~LOC~~+RFID DBL (SPONGE) IMPLANT
GAUZE SPONGE 4X4 12PLY STRL (GAUZE/BANDAGES/DRESSINGS) ×3 IMPLANT
GLOVE SURG LTX SZ8.5 (GLOVE) ×6 IMPLANT
GLOVE SURG POLYISO LF SZ7 (GLOVE) ×1 IMPLANT
GLOVE SURG UNDER POLY LF SZ8.5 (GLOVE) ×6 IMPLANT
GOWN STRL REUS W/ TWL LRG LVL3 (GOWN DISPOSABLE) IMPLANT
GOWN STRL REUS W/ TWL XL LVL3 (GOWN DISPOSABLE) IMPLANT
GOWN STRL REUS W/TWL 2XL LVL3 (GOWN DISPOSABLE) ×6 IMPLANT
GOWN STRL REUS W/TWL LRG LVL3 (GOWN DISPOSABLE) ×2
GOWN STRL REUS W/TWL XL LVL3 (GOWN DISPOSABLE)
GRAFT BNE CANC CHIPS 1-8 20CC (Bone Implant) IMPLANT
GRAFT BONE PROTEIOS LRG 5CC (Orthopedic Implant) ×1 IMPLANT
HEMOSTAT POWDER KIT SURGIFOAM (HEMOSTASIS) IMPLANT
KIT BASIN OR (CUSTOM PROCEDURE TRAY) ×3 IMPLANT
KIT GRAFTMAG DEL NEURO DISP (NEUROSURGERY SUPPLIES) ×3 IMPLANT
KIT TURNOVER KIT B (KITS) ×3 IMPLANT
MILL MEDIUM DISP (BLADE) ×3 IMPLANT
NEEDLE HYPO 22GX1.5 SAFETY (NEEDLE) ×3 IMPLANT
NS IRRIG 1000ML POUR BTL (IV SOLUTION) ×3 IMPLANT
PACK LAMINECTOMY NEURO (CUSTOM PROCEDURE TRAY) ×3 IMPLANT
PAD ARMBOARD 7.5X6 YLW CONV (MISCELLANEOUS) ×9 IMPLANT
PATTIES SURGICAL .5 X1 (DISPOSABLE) ×3 IMPLANT
PLASMABLADE 3.0S (MISCELLANEOUS) ×2
ROD RELIN-O LORD 5.5X65MM (Rod) ×2 IMPLANT
SCREW LOCK RELINE 5.5 TULIP (Screw) ×6 IMPLANT
SCREW RELINE-O POLY 6.5X45 (Screw) ×2 IMPLANT
SPONGE SURGIFOAM ABS GEL 100 (HEMOSTASIS) ×3 IMPLANT
SPONGE T-LAP 4X18 ~~LOC~~+RFID (SPONGE) IMPLANT
SUT PROLENE 6 0 BV (SUTURE) IMPLANT
SUT VIC AB 1 CT1 18XBRD ANBCTR (SUTURE) ×2 IMPLANT
SUT VIC AB 1 CT1 8-18 (SUTURE) ×2
SUT VIC AB 2-0 CP2 18 (SUTURE) ×3 IMPLANT
SUT VIC AB 3-0 SH 8-18 (SUTURE) ×3 IMPLANT
SUT VIC AB 4-0 RB1 18 (SUTURE) ×3 IMPLANT
SYR 3ML LL SCALE MARK (SYRINGE) ×12 IMPLANT
TOWEL GREEN STERILE (TOWEL DISPOSABLE) ×3 IMPLANT
TOWEL GREEN STERILE FF (TOWEL DISPOSABLE) ×3 IMPLANT
TRAY FOLEY MTR SLVR 16FR STAT (SET/KITS/TRAYS/PACK) ×3 IMPLANT
WATER STERILE IRR 1000ML POUR (IV SOLUTION) ×3 IMPLANT

## 2021-08-31 NOTE — Transfer of Care (Signed)
Immediate Anesthesia Transfer of Care Note  Patient: Darrell Fuller  Procedure(s) Performed: Lumbar three-four Posterior Lumbar Interbody Fusion (Back)  Patient Location: PACU  Anesthesia Type:General  Level of Consciousness: awake and alert   Airway & Oxygen Therapy: Patient Spontanous Breathing and Patient connected to face mask oxygen  Post-op Assessment: Report given to RN, Post -op Vital signs reviewed and stable and Patient moving all extremities X 4  Post vital signs: Reviewed and stable  Last Vitals:  Vitals Value Taken Time  BP 140/77 08/31/21 1950  Temp    Pulse 74 08/31/21 1952  Resp 16 08/31/21 1952  SpO2 100 % 08/31/21 1952  Vitals shown include unvalidated device data.  Last Pain:  Vitals:   08/31/21 1111  TempSrc:   PainSc: 6       Patients Stated Pain Goal: 2 (08/31/21 1111)  Complications: No notable events documented.

## 2021-08-31 NOTE — Op Note (Signed)
Date of surgery: 08/31/2021 Preoperative diagnosis: Spondylosis with stenosis L3-L4 history of decompression and fusion L4-L5 for spondylolisthesis. Postoperative diagnosis: Same Procedure: Bilateral laminotomies and decompression of L3 and L4 with more work than required for simple interbody technique.  Posterior lumbar interbody arthrodesis using local autograft and allograft peek spacers.  Posterolateral arthrodesis with local autograft and allograft L3-L4 segmental fixation of L3-L5 with pedicle screws revision of previous hardware to include L3-L5. Surgeon: Barnett Abu Anesthesia: General endotracheal Indications: Darrell Fuller is a 61 year old male who has had significant bilateral leg pain is found to have a centrally ruptured disc at L3-L4 with severe stenosis at that level.  Just in the past year he had undergone surgical decompression at L4-L5 for spondylolisthesis he noted that many of his symptoms had returned.  He was advised regarding the need for surgery.  Procedure: The patient was brought to the operating room supine on the stretcher.  After the smooth induction of general endotracheal anesthesia, he was carefully turned prone.  The back was prepped with alcohol DuraPrep and draped in a sterile fashion.  Midline incision was created and carried down to the lumbodorsal fascia just above the previous incision and the area of L3-L4 was dissected in the subcu periosteal fashion.  The superior portion of the hardware was identified and the hardware was then exposed tendon clued the entire length of the hardware.  The screws were were then released to remove the rods and the L4 screws were removed.  With the [facility] retractor in place then laminotomies were created removing the inferior margin lamina of L3 out to including the entirety of the facet at L3-4.  A laminotomy was then created removing the thickened redundant yellow ligament in this area until the dura was identified and then carefully  about working along the lateral aspect of the dura were able to identify the posterior longitudinal ligament ligament was noted to be bowed centrally and there was indeed a significant central disc herniation in this area.  The disc herniation was incised and a considerable amount of degenerated disc was removed from within the disc space and the subligamentous space.  Then by doing a discectomy on both sides a total discectomy was performed down to the cartilaginous endplates which were removed with a curette.  When the entirety of the disc space was completely evacuated spacer was tried for size it was felt that ultimately a 10 x 9 x 23 mm spacer would fit best into this area.  This was packed with a combination of autograft allograft and Proteus.  A total of 15 cc of bone graft was packed into the interspace in this fashion along with the 2 cages.  Care was taken to make sure that the L3 nerve root was protected superiorly and the L4 nerve root was also protected inferiorly.  Once the interbody grafting procedure was completed the remainder of the graft was packed into the lateral recesses pedicle entry site was chosen then at L3 and a 6.5 x 45 mm pedicle screws placed into the pedicles at L3 using fluoroscopic guidance to check for positioning of the screws within the pedicle and vertebral body.  Then a 65 mm precontoured rod was used to connect the 3 screws together that is L3-L4 and L5.  This was done in a neutral construct.  Torque was applied to the screws to lock them in position.  Lateral gutters which had been previously packed were then checked for hemostasis the wound was checked for hemostasis  and care was taken to make sure that the L3 and L4 nerve roots and the common dural tube were well decompressed.  Then the lumbodorsal fascia was closed with #1 Vicryl in interrupted fashion, 2-0 Vicryl in the subcutaneous tissues, 3-0 Vicryl subcuticularly along with some 4-0 Vicryl and the final subcuticular  closure.  Dermabond was placed on the wound blood loss was estimated 200 cc.  No Cell Saver blood was available.

## 2021-08-31 NOTE — Anesthesia Postprocedure Evaluation (Signed)
Anesthesia Post Note  Patient: Darrell Fuller  Procedure(s) Performed: Lumbar three-four Posterior Lumbar Interbody Fusion (Back)     Patient location during evaluation: PACU Anesthesia Type: General Level of consciousness: awake and alert Pain management: pain level controlled Vital Signs Assessment: post-procedure vital signs reviewed and stable Respiratory status: spontaneous breathing, nonlabored ventilation, respiratory function stable and patient connected to nasal cannula oxygen Cardiovascular status: blood pressure returned to baseline and stable Postop Assessment: no apparent nausea or vomiting Anesthetic complications: no   No notable events documented.  Last Vitals:  Vitals:   08/31/21 2020 08/31/21 2050  BP: 106/67 140/78  Pulse: 75 68  Resp: 18 18  Temp: 36.9 C 36.8 C  SpO2: 95% 100%    Last Pain:  Vitals:   08/31/21 2117  TempSrc:   PainSc: 7                  Kennieth Rad

## 2021-08-31 NOTE — Anesthesia Preprocedure Evaluation (Addendum)
Anesthesia Evaluation  Patient identified by MRN, date of birth, ID band Patient awake    Reviewed: Allergy & Precautions, NPO status , Patient's Chart, lab work & pertinent test results  Airway Mallampati: I  TM Distance: >3 FB Neck ROM: Full    Dental no notable dental hx. (+) Teeth Intact, Dental Advisory Given   Pulmonary asthma ,    Pulmonary exam normal breath sounds clear to auscultation       Cardiovascular Normal cardiovascular exam Rhythm:Regular Rate:Normal  HLD   Neuro/Psych PSYCHIATRIC DISORDERS Anxiety negative neurological ROS     GI/Hepatic Neg liver ROS, GERD  ,  Endo/Other  negative endocrine ROS  Renal/GU negative Renal ROS  negative genitourinary   Musculoskeletal  (+) Arthritis ,   Abdominal   Peds  Hematology negative hematology ROS (+)   Anesthesia Other Findings   Reproductive/Obstetrics                            Anesthesia Physical Anesthesia Plan  ASA: 2  Anesthesia Plan: General   Post-op Pain Management: Tylenol PO (pre-op)   Induction: Intravenous  PONV Risk Score and Plan: 2 and Midazolam, Dexamethasone and Ondansetron  Airway Management Planned: Oral ETT  Additional Equipment:   Intra-op Plan:   Post-operative Plan: Extubation in OR  Informed Consent: I have reviewed the patients History and Physical, chart, labs and discussed the procedure including the risks, benefits and alternatives for the proposed anesthesia with the patient or authorized representative who has indicated his/her understanding and acceptance.     Dental advisory given  Plan Discussed with: CRNA  Anesthesia Plan Comments:         Anesthesia Quick Evaluation

## 2021-08-31 NOTE — Anesthesia Procedure Notes (Signed)
Procedure Name: Intubation Date/Time: 08/31/2021 4:35 PM Performed by: Janace Litten, CRNA Pre-anesthesia Checklist: Patient identified, Emergency Drugs available, Suction available and Patient being monitored Patient Re-evaluated:Patient Re-evaluated prior to induction Oxygen Delivery Method: Circle System Utilized Preoxygenation: Pre-oxygenation with 100% oxygen Induction Type: IV induction Ventilation: Mask ventilation without difficulty Laryngoscope Size: Mac and 4 Grade View: Grade I Tube type: Oral Tube size: 7.5 mm Number of attempts: 1 Airway Equipment and Method: Stylet and Oral airway Placement Confirmation: ETT inserted through vocal cords under direct vision, positive ETCO2 and breath sounds checked- equal and bilateral Secured at: 22 cm Tube secured with: Tape Dental Injury: Teeth and Oropharynx as per pre-operative assessment

## 2021-08-31 NOTE — H&P (Signed)
Darrell Fuller is an 61 y.o. male.   Chief Complaint: Back pain bilateral leg weakness HPI: Darrell Fuller is a 61 year old individual who about a year ago underwent surgery to decompress and stabilize severe stenosis at L4-L5 secondary to the degenerative spondylolisthesis.  Patient had some mild degenerative change at L3-4 however in the year since his surgery he has developed severe stenosis with progressive leg weakness and difficulty walking distances.  An MRI in November demonstrated presence of severe spondylitic stenosis at L3-4 above his fusion.  He was advised regarding the need for surgery to decompress and stabilize this level.  He is now admitted to undergo surgical decompression and stabilization adding onto his L4-5 fusion.  Past Medical History:  Diagnosis Date   Abnormal liver function test    Allergic rhinitis    Anxiety    Asthmatic bronchitis    Attention deficit hyperactivity disorder    DJD (degenerative joint disease)    GERD (gastroesophageal reflux disease)    Hyperlipidemia    Lumbar back pain    Scoliosis    Testosterone deficiency     Past Surgical History:  Procedure Laterality Date   FOOT SURGERY Right    left shoulder surgery  2003   LUMBAR SPINE SURGERY  04/2009   Dr. Tonita Cong   right shoulder surgery     Dr. Amedeo Plenty   varicocele repair and vasectomy     Dr. Lawerance Bach    Family History  Problem Relation Age of Onset   Heart disease Father    Crohn's disease Sister    Colon cancer Neg Hx    Stomach cancer Neg Hx    Asthma Neg Hx    Allergic rhinitis Neg Hx    Eczema Neg Hx    Urticaria Neg Hx    Immunodeficiency Neg Hx    Angioedema Neg Hx    Social History:  reports that he has never smoked. He has never used smokeless tobacco. He reports current alcohol use of about 1.0 standard drink per week. He reports that he does not use drugs.  Allergies:  Allergies  Allergen Reactions   Hydrocodone-Acetaminophen Other (See Comments)    Skin crawling      Prednisone Other (See Comments)    Irritability and insomnia   Morphine And Related Hives   Pravachol [Pravastatin] Itching   Tramadol Itching    Cannot sleep   Zocor [Simvastatin] Itching and Rash    No medications prior to admission.    No results found for this or any previous visit (from the past 48 hour(s)). No results found.  Review of Systems  Constitutional:  Positive for activity change.  Musculoskeletal:  Positive for back pain and gait problem.  Neurological:  Positive for weakness and numbness.  All other systems reviewed and are negative.  There were no vitals taken for this visit. Physical Exam Constitutional:      Appearance: Normal appearance. He is normal weight.  HENT:     Head: Normocephalic and atraumatic.     Right Ear: Tympanic membrane normal.     Left Ear: Tympanic membrane normal.     Nose: Nose normal.     Mouth/Throat:     Mouth: Mucous membranes are moist.  Eyes:     Extraocular Movements: Extraocular movements intact.     Pupils: Pupils are equal, round, and reactive to light.  Cardiovascular:     Rate and Rhythm: Normal rate and regular rhythm.  Pulmonary:     Effort:  Pulmonary effort is normal.     Breath sounds: Normal breath sounds.  Abdominal:     General: Abdomen is flat.     Palpations: Abdomen is soft.  Musculoskeletal:     Cervical back: Normal range of motion and neck supple.     Comments: Positive straight leg raising at 60 degrees bilaterally Patrick's maneuver is negative bilaterally.  Skin:    Capillary Refill: Capillary refill takes less than 2 seconds.  Neurological:     Mental Status: He is alert.     Comments: Mild weakness in the quadriceps bilaterally graded 4- out of 5.  Absent patellar reflexes.  Trace Achilles reflexes bilaterally.  Sensation is diminished at the level of the knees and down the anterior shins bilaterally.  Upper extremity strength and reflexes are normal cranial nerve examination is normal.   Psychiatric:        Mood and Affect: Mood normal.        Behavior: Behavior normal.     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  History of fusion L4-L5.  Plan: Decompression fusion L3-4 with revision of fusion to extend to 3 4 from L4-5.  Earleen Newport, MD 08/31/2021, 8:06 AM

## 2021-08-31 NOTE — OR Nursing (Signed)
4 Locking caps and 2 rods explanted and discarded intraoperatively.

## 2021-09-01 MED ORDER — METHOCARBAMOL 500 MG PO TABS: 500.0000 mg | ORAL_TABLET | Freq: Four times a day (QID) | ORAL | 3 refills | Status: DC | PRN

## 2021-09-01 MED ORDER — OXYCODONE-ACETAMINOPHEN 5-325 MG PO TABS: 1.0000 | ORAL_TABLET | ORAL | 0 refills | Status: DC | PRN

## 2021-09-01 NOTE — Progress Notes (Signed)
Orthopedic Tech Progress Note Patient Details:  Darrell Fuller Jul 09, 1961 [id number]  Patient ID: [name], male   DOB: 07/12/1961, 61 y.o.   MRN: [id number] Pt has lso according to RN Trinna Post 09/01/2021, 1:49 AM

## 2021-09-01 NOTE — Evaluation (Signed)
Physical Therapy Evaluation and Discharge Patient Details Name: Darrell Fuller MRN: [id number] DOB: 1961/05/31 Today's Date: 09/01/2021  History of Present Illness  Pt is a 61 y/o M s/p bilateral laminectomies and decompression L3-4, posterolateral arthrodesis and L3-4 segemental fixation of L3-5 on 08/31/2021. PMH includes anxiety, DJD.   Clinical Impression  Patient evaluated by Physical Therapy with no further acute PT needs identified. All education has been completed and the patient has no further questions. Pt was able to demonstrate transfers and ambulation with gross modified independence and no AD. Pt was educated on precautions, brace application/wearing schedule, appropriate activity progression, and car transfer. See below for any follow-up Physical Therapy or equipment needs. PT is signing off. Thank you for this referral.        Recommendations for follow up therapy are one component of a multi-disciplinary discharge planning process, led by the attending physician.  Recommendations may be updated based on patient status, additional functional criteria and insurance authorization.  Follow Up Recommendations No PT follow up    Assistance Recommended at Discharge PRN  Patient can return home with the following       Equipment Recommendations None recommended by PT  Recommendations for Other Services       Functional Status Assessment Patient has had a recent decline in their functional status and demonstrates the ability to make significant improvements in function in a reasonable and predictable amount of time.     Precautions / Restrictions Precautions Precautions: Back Precaution Booklet Issued: Yes (comment) Precaution Comments: Reviewed handout and pt was cued for precautions during functional mobility. Required Braces or Orthoses: Spinal Brace Spinal Brace: Lumbar corset;Applied in sitting position Restrictions Weight Bearing Restrictions: No      Mobility  Bed  Mobility               General bed mobility comments: Reviewed handout with pt and wife, and pt was cued for precuations during functional mobility.    Transfers Overall transfer level: Modified independent Equipment used: None               General transfer comment: Good hand placement on seated surface for safety.    Ambulation/Gait Ambulation/Gait assistance: Modified independent (Device/Increase time) Gait Distance (Feet): 560 Feet Assistive device: None Gait Pattern/deviations: WFL(Within Functional Limits) Gait velocity: Decreased Gait velocity interpretation: >4.37 ft/sec, indicative of normal walking speed   General Gait Details: Pt ambulating well with no obvious gait deviations.  Stairs Stairs: Yes Stairs assistance: Modified independent (Device/Increase time) Stair Management: One rail Right;Alternating pattern;Forwards Number of Stairs: 10 General stair comments: Light cues for safety. No unsteadiness or LOB noted.  Wheelchair Mobility    Modified Rankin (Stroke Patients Only)       Balance Overall balance assessment: No apparent balance deficits (not formally assessed)                                           Pertinent Vitals/Pain Pain Assessment: No/denies pain    Home Living Family/patient expects to be discharged to:: Private residence Living Arrangements: Spouse/significant other Available Help at Discharge: Family Type of Home: House Home Access: Stairs to enter   Secretary/administrator of Steps: 5   Home Layout: Two level Home Equipment: Agricultural consultant (2 wheels);BSC/3in1;Shower seat      Prior Function Prior Level of Function : Independent/Modified Independent;Driving;Working/employed  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 Extremity Assessment Upper Extremity Assessment: Overall WFL for tasks assessed    Lower  Extremity Assessment Lower Extremity Assessment: Overall WFL for tasks assessed    Cervical / Trunk Assessment Cervical / Trunk Assessment: Back Surgery  Communication   Communication: No difficulties  Cognition Arousal/Alertness: Awake/alert Behavior During Therapy: WFL for tasks assessed/performed Overall Cognitive Status: Within Functional Limits for tasks assessed                                          General Comments General comments (skin integrity, edema, etc.): wife present during session    Exercises     Assessment/Plan    PT Assessment Patient does not need any further PT services  PT Problem List         PT Treatment Interventions      PT Goals (Current goals can be found in the Care Plan section)  Acute Rehab PT Goals Patient Stated Goal: Home today PT Goal Formulation: All assessment and education complete, DC therapy    Frequency       Co-evaluation               AM-PAC PT "6 Clicks" Mobility  Outcome Measure Help needed turning from your back to your side while in a flat bed without using bedrails?: None Help needed moving from lying on your back to sitting on the side of a flat bed without using bedrails?: None Help needed moving to and from a bed to a chair (including a wheelchair)?: None Help needed standing up from a chair using your arms (e.g., wheelchair or bedside chair)?: None Help needed to walk in hospital room?: None Help needed climbing 3-5 steps with a railing? : None 6 Click Score: 24    End of Session Equipment Utilized During Treatment: Back brace Activity Tolerance: Patient tolerated treatment well Patient left: Other (comment) (Standing in room with wife present. Pt reports he is most comfortable standing.) Nurse Communication: Mobility status PT Visit Diagnosis: Difficulty in walking, not elsewhere classified (R26.2)    Time: 5573-2202 PT Time Calculation (min) (ACUTE ONLY): 11 min   Charges:    PT Evaluation $PT Eval Low Complexity: 1 Low          Conni Slipper, PT, DPT Acute Rehabilitation Services Pager: [phone] Office: [phone]   Marylynn Pearson 09/01/2021, 10:50 AM

## 2021-09-01 NOTE — Progress Notes (Signed)
Patient alert and oriented, mae's well, voiding adequate amount of urine, swallowing without difficulty, no c/o pain at time of discharge. Patient discharged home with family. Script and discharged instructions given to patient. Patient and family stated understanding of instructions given. Patient has an appointment with Dr. Elsner  

## 2021-09-01 NOTE — Discharge Summary (Signed)
Physician Discharge Summary  Patient ID: Darrell Fuller MRN: [id number] DOB/AGE: 1961-07-11 61 y.o.  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  History of fusion L4-L5 Principal Problem:   Lumbar stenosis with neurogenic claudication   Discharged Condition: good  Hospital Course: Patient tolerated surgery well.  Consults: None  Significant Diagnostic Studies: None  Treatments: surgery: See op note  Discharge Exam: Blood pressure 127/76, pulse 69, temperature 98.5 F (36.9 C), temperature source Oral, resp. rate 18, height 5\' 6"  (1.676 m), weight 85.8 kg, SpO2 97 %. Incision is clean and dry motor function is intact.  Disposition: Discharge disposition: 01-Home or Self Care       Discharge Instructions     Call MD for:  redness, tenderness, or signs of infection (pain, swelling, redness, odor or green/yellow discharge around incision site)   Complete by: As directed    Call MD for:  severe uncontrolled pain   Complete by: As directed    Call MD for:  temperature >100.4   Complete by: As directed    Diet - low sodium heart healthy   Complete by: As directed    Discharge wound care:   Complete by: As directed    Okay to shower. Do not apply salves or appointments to incision. No heavy lifting with the upper extremities greater than 10 pounds. May resume driving when not requiring pain medication and patient feels comfortable with doing so.   Incentive spirometry RT   Complete by: As directed    Increase activity slowly   Complete by: As directed       Allergies as of 09/01/2021       Reactions   Hydrocodone-acetaminophen Other (See Comments)   Skin crawling   Prednisone Other (See Comments)   Irritability and insomnia   Morphine And Related Hives   Pravachol  [pravastatin] Itching   Tramadol Itching   Cannot sleep   Zocor [simvastatin] Itching, Rash        Medication List     TAKE these medications    albuterol 108 (90 Base) MCG/ACT inhaler Commonly known as: VENTOLIN HFA Inhale 2 puffs into the lungs every 6 (six) hours as needed for wheezing or shortness of breath.   cetirizine 10 MG tablet Commonly known as: ZyrTEC Allergy Take 1 tablet (10 mg total) by mouth daily.   Cholecalciferol 125 MCG (5000 UT) capsule Take 5,000 Units by mouth daily.   ezetimibe 10 MG tablet Commonly known as: ZETIA Take 10 mg by mouth daily.   ipratropium 0.03 % nasal spray Commonly known as: ATROVENT Place 1-2 sprays into both nostrils 2 (two) times daily as needed (nasal drainage).   methocarbamol 500 MG tablet Commonly known as: ROBAXIN Take 1 tablet (500 mg total) by mouth every 6 (six) hours as needed for muscle spasms.   montelukast 10 MG tablet Commonly known as: SINGULAIR Take 10 mg by mouth daily as needed (allergies).   omeprazole 40 MG capsule Commonly known as: PRILOSEC Take 40 mg by mouth daily as needed (acid reflux).   oxyCODONE-acetaminophen 5-325 MG tablet Commonly known as: PERCOCET/ROXICET Take 1-2 tablets by mouth every 4 (four) hours as needed for moderate pain or severe pain. What changed:  how much to take when to take this reasons to take this   Repatha SureClick 140 MG/ML Soaj Generic drug: Evolocumab ADMINISTER 1 ML  UNDER THE SKIN EVERY 14 DAYS   Ultra Omega-3 Fish Oil 1400 MG Caps Take 1,400 mg by mouth daily.               Discharge Care Instructions  (From admission, onward)           Start     Ordered   09/01/21 0000  Discharge wound care:       Comments: Okay to shower. Do not apply salves or appointments to incision. No heavy lifting with the upper extremities greater than 10 pounds. May resume driving when not requiring pain medication and patient feels comfortable with doing so.    09/01/21 1127             Signed: Shary Key Meaghan Whistler 09/01/2021, 11:27 AM

## 2021-09-01 NOTE — Evaluation (Addendum)
Occupational Therapy Evaluation Patient Details Name: Darrell Fuller MRN: [id number] DOB: [birth year] Today's Date: 09/01/2021   History of Present Illness Pt is a 61 y/o M s/p bilateral laminectomies and decompression L3-4, posterolateral arthrodesis adn L3-4 segemental fixation of L3-5. PMH includes anxiety, DJD, GERD, and HLD.   Clinical Impression   Pt independent at baseline with ADLs and functional mobility, currently supervision - min A for ADLs (increased assistance for LB ADL) and Mod I for transfers. Reviewed back precautions with pt using handout, as well as compensatory strategies for dressing/bathing, pt verbalized understanding. Pt sitting EOB upon arrival, able to verbalize steps for log rolling, as he gets OOB this way daily since prior back surgery. Pt presenting with impairments listed below, however has no acute OT needs at this time, will s/o. Recommend d/c home with assistance.      Recommendations for follow up therapy are one component of a multi-disciplinary discharge planning process, led by the attending physician.  Recommendations may be updated based on patient status, additional functional criteria and insurance authorization.   Follow Up Recommendations  No OT follow up    Assistance Recommended at Discharge Set up Supervision/Assistance  Patient can return home with the following Assistance with cooking/housework;A little help with bathing/dressing/bathroom    Functional Status Assessment  Patient has had a recent decline in their functional status and demonstrates the ability to make significant improvements in function in a reasonable and predictable amount of time.  Equipment Recommendations  None recommended by OT;Other (comment) (pt has all needed DME)    Recommendations for Other Services PT consult     Precautions / Restrictions Precautions Precautions: Back Precaution Booklet Issued: Yes (comment) Required Braces or Orthoses: Spinal Brace Spinal  Brace: Lumbar corset Restrictions Weight Bearing Restrictions: No      Mobility Bed Mobility               General bed mobility comments: pt sitting EOB upon arrival, able to verbalize steps for log rolling technique, states he used this technique with his back surgery in the past    Transfers Overall transfer level: Modified independent                        Balance Overall balance assessment: No apparent balance deficits (not formally assessed)                                         ADL either performed or assessed with clinical judgement   ADL Overall ADL's : Needs assistance/impaired Eating/Feeding: Independent;Sitting   Grooming: Independent;Sitting;Standing   Upper Body Bathing: Minimal assistance;Sitting   Lower Body Bathing: Minimal assistance;Sitting/lateral leans   Upper Body Dressing : Supervision/safety;Sitting;Standing Upper Body Dressing Details (indicate cue type and reason): dons LSO brace sitting EOB Lower Body Dressing: Minimal assistance;Sitting/lateral leans Lower Body Dressing Details (indicate cue type and reason): reports wife assisted with LB clothing, pt was able to pull clothing up once at knee Toilet Transfer: Supervision/safety   Toileting- Clothing Manipulation and Hygiene: Supervision/safety   Tub/ Shower Transfer: Walk-in shower;Supervision/safety   Functional mobility during ADLs: Supervision/safety       Vision   Vision Assessment?: No apparent visual deficits     Perception     Praxis      Pertinent Vitals/Pain Pain Assessment: No/denies pain     Hand Dominance  Extremity/Trunk Assessment Upper Extremity Assessment Upper Extremity Assessment: Overall WFL for tasks assessed   Lower Extremity Assessment Lower Extremity Assessment: Defer to PT evaluation   Cervical / Trunk Assessment Cervical / Trunk Assessment: Back Surgery   Communication Communication Communication: No  difficulties   Cognition Arousal/Alertness: Awake/alert Behavior During Therapy: WFL for tasks assessed/performed Overall Cognitive Status: Within Functional Limits for tasks assessed                                       General Comments  wife present during session    Exercises     Shoulder Instructions      Home Living Family/patient expects to be discharged to:: Private residence Living Arrangements: Spouse/significant other Available Help at Discharge: Family Type of Home: House Home Access: Stairs to enter Secretary/administrator of Steps: 5   Home Layout: Two level     Bathroom Shower/Tub: Walk-in shower         Home Equipment: Agricultural consultant (2 wheels);BSC/3in1;Shower seat          Prior Functioning/Environment Prior Level of Function : Independent/Modified Independent;Driving;Working/employed               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: Decreased range of motion;Impaired balance (sitting and/or standing);Decreased activity tolerance;Decreased strength      OT Treatment/Interventions:      OT Goals(Current goals can be found in the care plan section) Acute Rehab OT Goals Patient Stated Goal: to go home OT Goal Formulation: With patient Time For Goal Achievement: 09/15/21 Potential to Achieve Goals: Good  OT Frequency:      Co-evaluation              AM-PAC OT "6 Clicks" Daily Activity     Outcome Measure Help from another person eating meals?: None Help from another person taking care of personal grooming?: None Help from another person toileting, which includes using toliet, bedpan, or urinal?: None Help from another person bathing (including washing, rinsing, drying)?: A Little Help from another person to put on and taking off regular upper body clothing?: None Help from another person to put on and taking off regular lower body clothing?: A Little 6 Click Score: 22   End of Session  Equipment Utilized During Treatment: Back brace Nurse Communication: Mobility status  Activity Tolerance: Patient tolerated treatment well Patient left: in bed;with family/visitor present  OT Visit Diagnosis: Unsteadiness on feet (R26.81);Other abnormalities of gait and mobility (R26.89)                Time: 1017-5102 OT Time Calculation (min): 8 min Charges:  OT General Charges $OT Visit: 1 Visit OT Evaluation $OT Eval Low Complexity: 1 Low  Alfonzo Beers, OTD, OTR/L Acute Rehab [phone]   Mayer Masker 09/01/2021, 8:42 AM

## 2021-09-01 NOTE — Plan of Care (Signed)
Adequately ready for discharge 

## 2022-06-22 ENCOUNTER — Other Ambulatory Visit: Payer: Self-pay | Admitting: Otolaryngology

## 2022-06-22 DIAGNOSIS — H90A21 Sensorineural hearing loss, unilateral, right ear, with restricted hearing on the contralateral side: Secondary | ICD-10-CM

## 2022-06-22 DIAGNOSIS — H9311 Tinnitus, right ear: Secondary | ICD-10-CM

## 2022-07-06 ENCOUNTER — Encounter: Payer: Self-pay | Admitting: Otolaryngology

## 2022-07-11 ENCOUNTER — Ambulatory Visit
Admission: RE | Admit: 2022-07-11 | Discharge: 2022-07-11 | Disposition: A | Discharge status: Home / Self Care | Payer: BC Managed Care – PPO | Source: Ambulatory Visit | Attending: Otolaryngology | Admitting: Otolaryngology

## 2022-07-11 DIAGNOSIS — H9311 Tinnitus, right ear: Secondary | ICD-10-CM

## 2022-07-11 DIAGNOSIS — H90A21 Sensorineural hearing loss, unilateral, right ear, with restricted hearing on the contralateral side: Secondary | ICD-10-CM

## 2022-07-11 MED ORDER — GADOPICLENOL 0.5 MMOL/ML IV SOLN
8.0000 mL | Freq: Once | INTRAVENOUS | Status: AC | PRN
  Administered 2022-07-11: 8 mL via INTRAVENOUS

## 2022-07-21 ENCOUNTER — Encounter: Payer: Self-pay | Admitting: Gastroenterology

## 2022-09-17 ENCOUNTER — Other Ambulatory Visit (HOSPITAL_COMMUNITY): Payer: Self-pay

## 2024-05-28 ENCOUNTER — Other Ambulatory Visit: Payer: Self-pay | Admitting: Neurological Surgery

## 2024-05-28 DIAGNOSIS — M4316 Spondylolisthesis, lumbar region: Secondary | ICD-10-CM

## 2024-05-29 ENCOUNTER — Encounter: Payer: Self-pay | Admitting: Neurological Surgery

## 2024-05-31 ENCOUNTER — Inpatient Hospital Stay
Admission: RE | Admit: 2024-05-31 | Discharge: 2024-05-31 | Attending: Neurological Surgery | Admitting: Neurological Surgery

## 2024-05-31 DIAGNOSIS — M4316 Spondylolisthesis, lumbar region: Secondary | ICD-10-CM

## 2024-05-31 MED ORDER — GADOPICLENOL 0.5 MMOL/ML IV SOLN
8.5000 mL | Freq: Once | INTRAVENOUS | Status: AC | PRN
  Administered 2024-05-31: 8.5 mL via INTRAVENOUS

## 2024-06-05 ENCOUNTER — Other Ambulatory Visit: Payer: Self-pay | Admitting: Neurological Surgery

## 2024-06-12 ENCOUNTER — Other Ambulatory Visit

## 2024-06-29 NOTE — Progress Notes (Signed)
 Surgical Instructions   Your procedure is scheduled on Thursday July 05, 2024. Report to [facility] Main Entrance A at 8:30 A.M., then check in with the Admitting office. Any questions or running late day of surgery: call [phone]  Questions prior to your surgery date: call [phone], Monday-Friday, 8am-4pm. If you experience any cold or flu symptoms such as cough, fever, chills, shortness of breath, etc. between now and your scheduled surgery, please notify us  at the above number.     Remember:  Do not eat or drink after midnight the night before your surgery  Take these medicines the morning of surgery with A SIP OF WATER: None    May take these medicines IF NEEDED:None    One week prior to surgery, STOP taking any Aspirin (unless otherwise instructed by your surgeon) Aleve, Naproxen, Ibuprofen, Motrin, Advil, Goody's, BC's, all herbal medications, fish oil, and non-prescription vitamins.                     Do NOT Smoke (Tobacco/Vaping) for 24 hours prior to your procedure.  If you use a CPAP at night, you may bring your mask/headgear for your overnight stay.   You will be asked to remove any contacts, glasses, piercing's, hearing aid's, dentures/partials prior to surgery. Please bring cases for these items if needed.    Patients discharged the day of surgery will not be allowed to drive home, and someone needs to stay with them for 24 hours.  SURGICAL WAITING ROOM VISITATION Patients may have no more than 2 support people in the waiting area - these visitors may rotate.   Pre-op nurse will coordinate an appropriate time for 1 ADULT support person, who may not rotate, to accompany patient in pre-op.  Children under the age of 55 must have an adult with them who is not the patient and must remain in the main waiting area with an adult.  If the patient needs to stay at the hospital during part of their recovery, the visitor guidelines for inpatient rooms  apply.  Please refer to the [facility] website for the visitor guidelines for any additional information.   If you received a COVID test during your pre-op visit  it is requested that you wear a mask when out in public, stay away from anyone that may not be feeling well and notify your surgeon if you develop symptoms. If you have been in contact with anyone that has tested positive in the last 10 days please notify you surgeon.      Pre-operative 4 CHG Bathing Instructions   You can play a key role in reducing the risk of infection after surgery. Your skin needs to be as free of germs as possible. You can reduce the number of germs on your skin by washing with CHG (chlorhexidine  gluconate) soap before surgery. CHG is an antiseptic soap that kills germs and continues to kill germs even after washing.   DO NOT use if you have an allergy to chlorhexidine /CHG or antibacterial soaps. If your skin becomes reddened or irritated, stop using the CHG and notify one of our RNs at [phone].   Please shower with the CHG soap starting 4 days before surgery using the following schedule:     Please keep in mind the following:  You may shave your face at any point before/day of surgery.  Place clean sheets on your bed the day you start using CHG soap. Use a clean washcloth (not used since being  washed) for each shower. DO NOT sleep with pets once you start using the CHG.   CHG Shower Instructions:  Wash your face and private area with normal soap. If you choose to wash your hair, wash first with your normal shampoo.  After you use shampoo/soap, rinse your hair and body thoroughly to remove shampoo/soap residue.  Turn the water OFF and apply  bottle of CHG soap to a CLEAN washcloth.  Apply CHG soap ONLY FROM YOUR NECK DOWN TO YOUR TOES (washing for 3-5 minutes)  DO NOT use CHG soap on face, private areas, open wounds, or sores.  Pay special attention to the area where your surgery is being  performed.  If you are having back surgery, having someone wash your back for you may be helpful. Wait 2 minutes after CHG soap is applied, then you may rinse off the CHG soap.  Pat dry with a clean towel  Put on clean clothes/pajamas   If you choose to wear lotion, please use ONLY the CHG-compatible lotions that are listed below.  Additional instructions for the day of surgery:  If you choose, you may shower the morning of surgery with an antibacterial soap.  DO NOT APPLY any lotions, deodorants or cologne.   Do not bring valuables to the hospital. [facility] is not responsible for any belongings/valuables. Do not wear jewelry Put on clean/comfortable clothes.  Please brush your teeth.  Ask your nurse before applying any prescription medications to the skin.     CHG Compatible Lotions   Aveeno Moisturizing lotion  Cetaphil Moisturizing Cream  Cetaphil Moisturizing Lotion  Clairol Herbal Essence Moisturizing Lotion, Dry Skin  Clairol Herbal Essence Moisturizing Lotion, Extra Dry Skin  Clairol Herbal Essence Moisturizing Lotion, Normal Skin  Curel Age Defying Therapeutic Moisturizing Lotion with Alpha Hydroxy  Curel Extreme Care Body Lotion  Curel Soothing Hands Moisturizing Hand Lotion  Curel Therapeutic Moisturizing Cream, Fragrance-Free  Curel Therapeutic Moisturizing Lotion, Fragrance-Free  Curel Therapeutic Moisturizing Lotion, Original Formula  Eucerin Daily Replenishing Lotion  Eucerin Dry Skin Therapy Plus Alpha Hydroxy Crme  Eucerin Dry Skin Therapy Plus Alpha Hydroxy Lotion  Eucerin Original Crme  Eucerin Original Lotion  Eucerin Plus Crme Eucerin Plus Lotion  Eucerin TriLipid Replenishing Lotion  Keri Anti-Bacterial Hand Lotion  Keri Deep Conditioning Original Lotion Dry Skin Formula Softly Scented  Keri Deep Conditioning Original Lotion, Fragrance Free Sensitive Skin Formula  Keri Lotion Fast Absorbing Fragrance Free Sensitive Skin Formula  Keri Lotion Fast  Absorbing Softly Scented Dry Skin Formula  Keri Original Lotion  Keri Skin Renewal Lotion Keri Silky Smooth Lotion  Keri Silky Smooth Sensitive Skin Lotion  Nivea Body Creamy Conditioning Oil  Nivea Body Extra Enriched Lotion  Nivea Body Original Lotion  Nivea Body Sheer Moisturizing Lotion Nivea Crme  Nivea Skin Firming Lotion  NutraDerm 30 Skin Lotion  NutraDerm Skin Lotion  NutraDerm Therapeutic Skin Cream  NutraDerm Therapeutic Skin Lotion  ProShield Protective Hand Cream  Provon moisturizing lotion  Please read over the following fact sheets that you were given.

## 2024-07-02 ENCOUNTER — Other Ambulatory Visit: Payer: Self-pay

## 2024-07-02 ENCOUNTER — Encounter (HOSPITAL_COMMUNITY): Payer: Self-pay

## 2024-07-02 ENCOUNTER — Encounter (HOSPITAL_COMMUNITY)
Admission: RE | Admit: 2024-07-02 | Discharge: 2024-07-02 | Disposition: A | Discharge status: Home / Self Care | Source: Ambulatory Visit | Attending: Neurological Surgery | Admitting: Neurological Surgery

## 2024-07-02 VITALS — BP 147/71 | Temp 98.3°F | Resp 17 | Ht 66.0 in | Wt 173.1 lb

## 2024-07-02 DIAGNOSIS — Z01818 Encounter for other preprocedural examination: Secondary | ICD-10-CM | POA: Diagnosis present

## 2024-07-02 DIAGNOSIS — Z01812 Encounter for preprocedural laboratory examination: Secondary | ICD-10-CM | POA: Insufficient documentation

## 2024-07-02 LAB — TYPE AND SCREEN
ABO/RH(D): O POS
Antibody Screen: NEGATIVE

## 2024-07-02 LAB — CBC
HCT: 46 % (ref 39.0–52.0)
Hemoglobin: 15.3 g/dL (ref 13.0–17.0)
MCH: 31.3 pg (ref 26.0–34.0)
MCHC: 33.3 g/dL (ref 30.0–36.0)
MCV: 94.1 fL (ref 80.0–100.0)
Platelets: 174 K/uL (ref 150–400)
RBC: 4.89 MIL/uL (ref 4.22–5.81)
RDW: 13.1 % (ref 11.5–15.5)
WBC: 5.9 K/uL (ref 4.0–10.5)
nRBC: 0 % (ref 0.0–0.2)

## 2024-07-02 LAB — BASIC METABOLIC PANEL WITH GFR
Anion gap: 10 (ref 5–15)
BUN: 20 mg/dL (ref 8–23)
CO2: 26 mmol/L (ref 22–32)
Calcium: 8.7 mg/dL — ABNORMAL LOW (ref 8.9–10.3)
Chloride: 104 mmol/L (ref 98–111)
Creatinine, Ser: 0.98 mg/dL (ref 0.61–1.24)
GFR, Estimated: >60 mL/min (ref >60)
Glucose, Bld: 88 mg/dL (ref 70–99)
Potassium: 3.9 mmol/L (ref 3.5–5.1)
Sodium: 140 mmol/L (ref 135–145)

## 2024-07-02 NOTE — Progress Notes (Signed)
 PCP - Rosina Bullock Cardiologist - Dr. Barnet Kendall @ [facility] LOV 08-17-22 - no longer see's unless needed  PPM/ICD - Denies Device Orders - n/a Rep Notified - n/a  Chest x-ray - n/a EKG - n/a Stress Test - 08-19-22 ECHO - 03-06-18 Cardiac Cath - Denies  Sleep Study - Denies CPAP - n/a  Non-diabetic  Last dose of GLP1 agonist-  Denies GLP1 instructions:   Blood Thinner Instructions:Denies Aspirin Instructions:Denies  ERAS Protcol - npo PRE-SURGERY Ensure or G2- none  COVID TEST- n/a   Anesthesia review: no  Patient denies shortness of breath, fever, cough and chest pain at PAT appointment. Patient denies any respiratory issues at this time   All instructions explained to the patient, with a verbal understanding of the material. Patient agrees to go over the instructions while at home for a better understanding. Patient also instructed to self quarantine after being tested for COVID-19. The opportunity to ask questions was provided.

## 2024-07-05 ENCOUNTER — Other Ambulatory Visit: Payer: Self-pay

## 2024-07-05 ENCOUNTER — Ambulatory Visit (HOSPITAL_COMMUNITY)

## 2024-07-05 ENCOUNTER — Observation Stay (HOSPITAL_COMMUNITY)
Admission: RE | Admit: 2024-07-05 | Discharge: 2024-07-05 | Disposition: A | Discharge status: Home / Self Care | Attending: Neurological Surgery | Admitting: Neurological Surgery

## 2024-07-05 ENCOUNTER — Encounter (HOSPITAL_COMMUNITY)
Admission: RE | Disposition: A | Discharge status: Home / Self Care | Payer: Self-pay | Source: Home / Self Care | Attending: Neurological Surgery

## 2024-07-05 ENCOUNTER — Ambulatory Visit (HOSPITAL_COMMUNITY): Admitting: Physician Assistant

## 2024-07-05 ENCOUNTER — Encounter (HOSPITAL_COMMUNITY): Payer: Self-pay | Admitting: Neurological Surgery

## 2024-07-05 DIAGNOSIS — M5440 Lumbago with sciatica, unspecified side: Secondary | ICD-10-CM | POA: Diagnosis present

## 2024-07-05 DIAGNOSIS — M4316 Spondylolisthesis, lumbar region: Secondary | ICD-10-CM | POA: Diagnosis not present

## 2024-07-05 DIAGNOSIS — M48062 Spinal stenosis, lumbar region with neurogenic claudication: Secondary | ICD-10-CM | POA: Diagnosis not present

## 2024-07-05 DIAGNOSIS — J45901 Unspecified asthma with (acute) exacerbation: Secondary | ICD-10-CM | POA: Insufficient documentation

## 2024-07-05 DIAGNOSIS — M4726 Other spondylosis with radiculopathy, lumbar region: Secondary | ICD-10-CM | POA: Insufficient documentation

## 2024-07-05 HISTORY — PX: ANTERIOR LAT LUMBAR FUSION: SHX1168

## 2024-07-05 LAB — SURGICAL PCR SCREEN
MRSA, PCR: NEGATIVE
Staphylococcus aureus: NEGATIVE

## 2024-07-05 SURGERY — ANTERIOR LATERAL LUMBAR FUSION 1 LEVEL
Anesthesia: General

## 2024-07-05 MED ORDER — ONDANSETRON HCL 4 MG PO TABS: 4.0000 mg | ORAL_TABLET | Freq: Four times a day (QID) | ORAL | Status: DC | PRN

## 2024-07-05 MED ORDER — POLYETHYLENE GLYCOL 3350 17 G PO PACK: 17.0000 g | PACK | Freq: Every day | ORAL | Status: DC | PRN

## 2024-07-05 MED ORDER — CHLORHEXIDINE GLUCONATE CLOTH 2 % EX PADS: 6.0000 | MEDICATED_PAD | Freq: Once | CUTANEOUS | Status: DC

## 2024-07-05 MED ORDER — CHLORHEXIDINE GLUCONATE 0.12 % MT SOLN
15.0000 mL | Freq: Once | OROMUCOSAL | Status: AC
  Administered 2024-07-05: 15 mL via OROMUCOSAL
  Filled 2024-07-05: qty 15

## 2024-07-05 MED ORDER — SODIUM CHLORIDE 0.9 % IV SOLN
250.0000 mL | INTRAVENOUS | Status: DC
  Administered 2024-07-05: 250 mL via INTRAVENOUS

## 2024-07-05 MED ORDER — AMISULPRIDE (ANTIEMETIC) 5 MG/2ML IV SOLN: 10.0000 mg | Freq: Once | INTRAVENOUS | Status: DC | PRN

## 2024-07-05 MED ORDER — METHOCARBAMOL 1000 MG/10ML IJ SOLN: 500.0000 mg | Freq: Four times a day (QID) | INTRAMUSCULAR | Status: DC | PRN

## 2024-07-05 MED ORDER — DOCUSATE SODIUM 100 MG PO CAPS: 100.0000 mg | ORAL_CAPSULE | Freq: Two times a day (BID) | ORAL | Status: DC

## 2024-07-05 MED ORDER — 0.9 % SODIUM CHLORIDE (POUR BTL) OPTIME
TOPICAL | Status: DC | PRN
  Administered 2024-07-05 (×2): 1000 mL

## 2024-07-05 MED ORDER — ENOXAPARIN SODIUM 40 MG/0.4ML IJ SOSY: 40.0000 mg | PREFILLED_SYRINGE | INTRAMUSCULAR | Status: DC

## 2024-07-05 MED ORDER — PHENYLEPHRINE HCL-NACL 20-0.9 MG/250ML-% IV SOLN
INTRAVENOUS | Status: DC | PRN
  Administered 2024-07-05: 30 ug/min via INTRAVENOUS

## 2024-07-05 MED ORDER — ACETAMINOPHEN 10 MG/ML IV SOLN: 1000.0000 mg | Freq: Once | INTRAVENOUS | Status: DC | PRN

## 2024-07-05 MED ORDER — SUCCINYLCHOLINE CHLORIDE 200 MG/10ML IV SOSY
PREFILLED_SYRINGE | INTRAVENOUS | Status: DC | PRN
  Administered 2024-07-05: 80 mg via INTRAVENOUS

## 2024-07-05 MED ORDER — FLEET ENEMA RE ENEM
1.0000 | ENEMA | Freq: Once | RECTAL | Status: DC | PRN
Start: 2024-07-05 — End: 2024-07-05
  Filled 2024-07-05: qty 1

## 2024-07-05 MED ORDER — METHOCARBAMOL 500 MG PO TABS: 500.0000 mg | ORAL_TABLET | Freq: Four times a day (QID) | ORAL | 2 refills | Status: DC | PRN

## 2024-07-05 MED ORDER — MENTHOL 3 MG MT LOZG: 1.0000 | LOZENGE | OROMUCOSAL | Status: DC | PRN

## 2024-07-05 MED ORDER — LIDOCAINE-EPINEPHRINE 1 %-1:100000 IJ SOLN
INTRAMUSCULAR | Status: DC | PRN
  Administered 2024-07-05: 4 mL

## 2024-07-05 MED ORDER — SODIUM CHLORIDE 0.9% FLUSH
3.0000 mL | Freq: Two times a day (BID) | INTRAVENOUS | Status: DC
  Administered 2024-07-05: 3 mL via INTRAVENOUS

## 2024-07-05 MED ORDER — HYDROMORPHONE HCL 1 MG/ML IJ SOLN: 0.5000 mg | INTRAMUSCULAR | Status: DC | PRN

## 2024-07-05 MED ORDER — ONDANSETRON HCL 4 MG/2ML IJ SOLN
INTRAMUSCULAR | Status: DC | PRN
  Administered 2024-07-05: 4 mg via INTRAVENOUS

## 2024-07-05 MED ORDER — THROMBIN 5000 UNITS EX SOLR: OROMUCOSAL | Status: DC | PRN

## 2024-07-05 MED ORDER — ONDANSETRON HCL 4 MG/2ML IJ SOLN
INTRAMUSCULAR | Status: AC
  Filled 2024-07-05: qty 2

## 2024-07-05 MED ORDER — OXYCODONE-ACETAMINOPHEN 5-325 MG PO TABS: 1.0000 | ORAL_TABLET | Freq: Four times a day (QID) | ORAL | 0 refills | Status: DC | PRN

## 2024-07-05 MED ORDER — PHENYLEPHRINE 80 MCG/ML (10ML) SYRINGE FOR IV PUSH (FOR BLOOD PRESSURE SUPPORT)
PREFILLED_SYRINGE | INTRAVENOUS | Status: DC | PRN
  Administered 2024-07-05: 80 ug via INTRAVENOUS
  Administered 2024-07-05: 160 ug via INTRAVENOUS

## 2024-07-05 MED ORDER — PHENOL 1.4 % MT LIQD: 1.0000 | OROMUCOSAL | Status: DC | PRN

## 2024-07-05 MED ORDER — FENTANYL CITRATE (PF) 250 MCG/5ML IJ SOLN
INTRAMUSCULAR | Status: AC
  Filled 2024-07-05: qty 5

## 2024-07-05 MED ORDER — ACETAMINOPHEN 325 MG PO TABS: 650.0000 mg | ORAL_TABLET | ORAL | Status: DC | PRN

## 2024-07-05 MED ORDER — ROCURONIUM BROMIDE 10 MG/ML (PF) SYRINGE
PREFILLED_SYRINGE | INTRAVENOUS | Status: AC
  Filled 2024-07-05: qty 10

## 2024-07-05 MED ORDER — OXYCODONE HCL 5 MG/5ML PO SOLN: 5.0000 mg | Freq: Once | ORAL | Status: DC | PRN

## 2024-07-05 MED ORDER — BUPIVACAINE HCL (PF) 0.5 % IJ SOLN
INTRAMUSCULAR | Status: DC | PRN
  Administered 2024-07-05: 4 mL

## 2024-07-05 MED ORDER — LIDOCAINE 2% (20 MG/ML) 5 ML SYRINGE
INTRAMUSCULAR | Status: AC
Start: 2024-07-05 — End: 2024-07-05
  Filled 2024-07-05: qty 5

## 2024-07-05 MED ORDER — OXYCODONE-ACETAMINOPHEN 5-325 MG PO TABS: 1.0000 | ORAL_TABLET | Freq: Four times a day (QID) | ORAL | Status: DC | PRN

## 2024-07-05 MED ORDER — PROPOFOL 10 MG/ML IV BOLUS
INTRAVENOUS | Status: DC | PRN
  Administered 2024-07-05: 100 mg via INTRAVENOUS
  Administered 2024-07-05: 30 ug/kg/min via INTRAVENOUS
  Administered 2024-07-05: 200 mg via INTRAVENOUS

## 2024-07-05 MED ORDER — BUPIVACAINE HCL (PF) 0.5 % IJ SOLN
INTRAMUSCULAR | Status: AC
  Filled 2024-07-05: qty 30

## 2024-07-05 MED ORDER — ORAL CARE MOUTH RINSE: 15.0000 mL | Freq: Once | OROMUCOSAL | Status: AC

## 2024-07-05 MED ORDER — PROPOFOL 10 MG/ML IV BOLUS
INTRAVENOUS | Status: AC
  Filled 2024-07-05: qty 20

## 2024-07-05 MED ORDER — METHOCARBAMOL 500 MG PO TABS: 500.0000 mg | ORAL_TABLET | Freq: Four times a day (QID) | ORAL | Status: DC | PRN

## 2024-07-05 MED ORDER — LACTATED RINGERS IV SOLN: INTRAVENOUS | Status: DC

## 2024-07-05 MED ORDER — LIDOCAINE 2% (20 MG/ML) 5 ML SYRINGE
INTRAMUSCULAR | Status: DC | PRN
  Administered 2024-07-05: 60 mg via INTRAVENOUS

## 2024-07-05 MED ORDER — FENTANYL CITRATE (PF) 100 MCG/2ML IJ SOLN
25.0000 ug | INTRAMUSCULAR | Status: DC | PRN
  Administered 2024-07-05: 50 ug via INTRAVENOUS

## 2024-07-05 MED ORDER — LIDOCAINE-EPINEPHRINE 1 %-1:100000 IJ SOLN
INTRAMUSCULAR | Status: AC
  Filled 2024-07-05: qty 1

## 2024-07-05 MED ORDER — THROMBIN 5000 UNITS EX KIT
PACK | CUTANEOUS | Status: AC
Start: 2024-07-05 — End: 2024-07-05
  Filled 2024-07-05: qty 1

## 2024-07-05 MED ORDER — FENTANYL CITRATE (PF) 100 MCG/2ML IJ SOLN
INTRAMUSCULAR | Status: AC
  Filled 2024-07-05: qty 2

## 2024-07-05 MED ORDER — ONDANSETRON HCL 4 MG/2ML IJ SOLN: 4.0000 mg | Freq: Four times a day (QID) | INTRAMUSCULAR | Status: DC | PRN

## 2024-07-05 MED ORDER — MIDAZOLAM HCL (PF) 2 MG/2ML IJ SOLN
INTRAMUSCULAR | Status: DC | PRN
  Administered 2024-07-05: 2 mg via INTRAVENOUS

## 2024-07-05 MED ORDER — CEFAZOLIN SODIUM-DEXTROSE 2-4 GM/100ML-% IV SOLN
2.0000 g | INTRAVENOUS | Status: AC
  Administered 2024-07-05: 2 g via INTRAVENOUS
  Filled 2024-07-05: qty 100

## 2024-07-05 MED ORDER — FENTANYL CITRATE (PF) 250 MCG/5ML IJ SOLN
INTRAMUSCULAR | Status: DC | PRN
Start: 2024-07-05 — End: 2024-07-05
  Administered 2024-07-05: 50 ug via INTRAVENOUS
  Administered 2024-07-05 (×2): 100 ug via INTRAVENOUS
  Administered 2024-07-05 (×2): 50 ug via INTRAVENOUS

## 2024-07-05 MED ORDER — EPHEDRINE SULFATE-NACL 50-0.9 MG/10ML-% IV SOSY
PREFILLED_SYRINGE | INTRAVENOUS | Status: DC | PRN
  Administered 2024-07-05: 5 mg via INTRAVENOUS
  Administered 2024-07-05 (×2): 10 mg via INTRAVENOUS

## 2024-07-05 MED ORDER — BISACODYL 10 MG RE SUPP: 10.0000 mg | Freq: Every day | RECTAL | Status: DC | PRN

## 2024-07-05 MED ORDER — SODIUM CHLORIDE 0.9% FLUSH: 3.0000 mL | INTRAVENOUS | Status: DC | PRN

## 2024-07-05 MED ORDER — MIDAZOLAM HCL 2 MG/2ML IJ SOLN
INTRAMUSCULAR | Status: AC
  Filled 2024-07-05: qty 2

## 2024-07-05 MED ORDER — EPHEDRINE 5 MG/ML INJ
INTRAVENOUS | Status: AC
Start: 2024-07-05 — End: 2024-07-05
  Filled 2024-07-05: qty 5

## 2024-07-05 MED ORDER — OXYCODONE HCL 5 MG PO TABS: 5.0000 mg | ORAL_TABLET | Freq: Once | ORAL | Status: DC | PRN

## 2024-07-05 MED ORDER — SODIUM CHLORIDE (PF) 0.9 % IJ SOLN
INTRAMUSCULAR | Status: AC
Start: 2024-07-05 — End: 2024-07-05
  Filled 2024-07-05: qty 20

## 2024-07-05 MED ORDER — SENNA 8.6 MG PO TABS: 1.0000 | ORAL_TABLET | Freq: Two times a day (BID) | ORAL | Status: DC

## 2024-07-05 MED ORDER — ACETAMINOPHEN 650 MG RE SUPP: 650.0000 mg | RECTAL | Status: DC | PRN

## 2024-07-05 MED ORDER — CEFAZOLIN SODIUM-DEXTROSE 2-4 GM/100ML-% IV SOLN
2.0000 g | Freq: Three times a day (TID) | INTRAVENOUS | Status: DC
  Administered 2024-07-05: 2 g via INTRAVENOUS
  Filled 2024-07-05: qty 100

## 2024-07-05 SURGICAL SUPPLY — 44 items
BAG COUNTER SPONGE SURGICOUNT (BAG) ×2 IMPLANT
BLADE CLIPPER SURG (BLADE) IMPLANT
BOLT 5.0X45 SPINAL (Bolt) IMPLANT
BOLT 5.0X50 SPINAL (Bolt) IMPLANT
BOLT SPNL LRG 45X5.5XPLAT NS (Screw) IMPLANT
BONE MATRIX OSTEOCEL PRO LRG (Bone Implant) IMPLANT
DERMABOND ADVANCED .7 DNX12 (GAUZE/BANDAGES/DRESSINGS) ×2 IMPLANT
DRAPE C-ARM 42X72 X-RAY (DRAPES) ×2 IMPLANT
DRAPE C-ARMOR (DRAPES) ×2 IMPLANT
DRAPE LAPAROTOMY 100X72X124 (DRAPES) ×2 IMPLANT
DURAPREP 26ML APPLICATOR (WOUND CARE) ×2 IMPLANT
ELECT BLADE 6.5 EXT (BLADE) IMPLANT
ELECT NVM5 SURFACE MEP/EMG (ELECTRODE) IMPLANT
ELECTRODE REM PT RTRN 9FT ADLT (ELECTROSURGICAL) ×2 IMPLANT
FEE NCS SETUP TEAR DOWN NVM5 (MISCELLANEOUS) IMPLANT
GAUZE 4X4 16PLY ~~LOC~~+RFID DBL (SPONGE) IMPLANT
GAUZE SPONGE 4X4 12PLY STRL (GAUZE/BANDAGES/DRESSINGS) IMPLANT
GLOVE BIOGEL PI IND STRL 7.5 (GLOVE) IMPLANT
GLOVE BIOGEL PI IND STRL 8.5 (GLOVE) ×2 IMPLANT
GLOVE ECLIPSE 7.5 STRL STRAW (GLOVE) IMPLANT
GLOVE ECLIPSE 8.5 STRL (GLOVE) ×2 IMPLANT
GOWN STRL REUS W/ TWL LRG LVL3 (GOWN DISPOSABLE) IMPLANT
GOWN STRL REUS W/ TWL XL LVL3 (GOWN DISPOSABLE) ×2 IMPLANT
GOWN STRL REUS W/TWL 2XL LVL3 (GOWN DISPOSABLE) ×2 IMPLANT
HEMOSTAT POWDER KIT SURGIFOAM (HEMOSTASIS) IMPLANT
KIT BASIN OR (CUSTOM PROCEDURE TRAY) ×2 IMPLANT
KIT DILATOR XLIF 5 (KITS) IMPLANT
KIT SURGICAL ACCESS MAXCESS 4 (KITS) IMPLANT
KIT TURNOVER KIT B (KITS) ×2 IMPLANT
MODULUS XLW 12X22X50MM 10DEG (Spine Construct) IMPLANT
NDL HYPO 25X1 1.5 SAFETY (NEEDLE) ×2 IMPLANT
NEEDLE HYPO 25X1 1.5 SAFETY (NEEDLE) ×1 IMPLANT
PACK LAMINECTOMY NEURO (CUSTOM PROCEDURE TRAY) ×2 IMPLANT
PLATE DECADE 4HOLE SZ12 XLIF (Plate) IMPLANT
SCREW DECADE 5.5X50 (Screw) IMPLANT
SOLN 0.9% NACL POUR BTL 1000ML (IV SOLUTION) ×2 IMPLANT
SOLN STERILE WATER BTL 1000 ML (IV SOLUTION) ×2 IMPLANT
SPONGE T-LAP 4X18 ~~LOC~~+RFID (SPONGE) IMPLANT
SUT VIC AB 3-0 SH 8-18 (SUTURE) ×2 IMPLANT
SUT VIC AB 4-0 RB1 18 (SUTURE) ×2 IMPLANT
TAPE CLOTH 4X10 WHT NS (GAUZE/BANDAGES/DRESSINGS) ×4 IMPLANT
TOWEL GREEN STERILE (TOWEL DISPOSABLE) ×2 IMPLANT
TOWEL GREEN STERILE FF (TOWEL DISPOSABLE) ×2 IMPLANT
TRAY FOLEY MTR SLVR 16FR STAT (SET/KITS/TRAYS/PACK) ×2 IMPLANT

## 2024-07-05 NOTE — Evaluation (Signed)
 Occupational Therapy Evaluation Patient Details Name: Darrell Fuller MRN: [id number] DOB: [birth year] Today's Date: 07/05/2024   History of Present Illness   Pt is a 63 y/o M s/p anterolateral decompression L2-3 with XLIF on 11/13. PMH includes anxiety, DJD, ADHD, GERD, HLD, back pain, scoliosis, back surgery, R foot surgery, L shoulder surgery     Clinical Impressions Pt ind at baseline with ADLs/functional mobility, lives with spouse who can assist at d/c. Pt currently performing ADLs without assist, ind with bed mobility via log roll and ambulation. Pt educated on back precautions, brace wear, and compensatory strategies/AE use for ADLs and pt able to verbalize/demo understanding. Pt presenting with impairments listed below, however has no further acute OT needs at this time. Anticipate no OT follow up needs at d/c.      If plan is discharge home, recommend the following:   Assistance with cooking/housework;Assist for transportation     Functional Status Assessment   Patient has had a recent decline in their functional status and demonstrates the ability to make significant improvements in function in a reasonable and predictable amount of time.     Equipment Recommendations   None recommended by OT     Recommendations for Other Services         Precautions/Restrictions   Precautions Precautions: Back Precaution Booklet Issued: Yes (comment) Recall of Precautions/Restrictions: Intact Precaution/Restrictions Comments: educated  on 3/3 back prec Required Braces or Orthoses: Spinal Brace Spinal Brace: Applied in sitting position Restrictions Weight Bearing Restrictions Per Provider Order: No     Mobility Bed Mobility Overal bed mobility: Independent                  Transfers Overall transfer level: Independent                        Balance Overall balance assessment: No apparent balance deficits (not formally assessed)                                          ADL either performed or assessed with clinical judgement   ADL Overall ADL's : At baseline                                       General ADL Comments: educated on compensatory strategies for ADLs, educated on use of reacher for LB ADLs     Vision   Vision Assessment?: No apparent visual deficits     Perception Perception: Not tested       Praxis Praxis: Not tested       Pertinent Vitals/Pain Pain Assessment Pain Assessment: Faces Pain Score: 0-No pain Pain Intervention(s): Monitored during session     Extremity/Trunk Assessment Upper Extremity Assessment Upper Extremity Assessment: Overall WFL for tasks assessed   Lower Extremity Assessment Lower Extremity Assessment: Overall WFL for tasks assessed (mild LLE pain)   Cervical / Trunk Assessment Cervical / Trunk Assessment: Back Surgery   Communication Communication Communication: No apparent difficulties   Cognition Arousal: Alert Behavior During Therapy: WFL for tasks assessed/performed Cognition: No apparent impairments                               Following commands: Intact  Cueing  General Comments   Cueing Techniques: Verbal cues  VSS on RA   Exercises     Shoulder Instructions      Home Living Family/patient expects to be discharged to:: Private residence Living Arrangements: Spouse/significant other Available Help at Discharge: Family;Available PRN/intermittently Type of Home: House Home Access: Stairs to enter Entrance Stairs-Number of Steps: 5   Home Layout: Two level Alternate Level Stairs-Number of Steps: flight Alternate Level Stairs-Rails: Left Bathroom Shower/Tub: Walk-in shower         Home Equipment: Scientist, Physiological Equipment: Reacher        Prior Functioning/Environment Prior Level of Function : Independent/Modified Independent;Driving;Working/employed             Mobility  Comments: ind ADLs Comments: works at a [employer] in [employer]    OT Problem List: Decreased activity tolerance;Impaired balance (sitting and/or standing);Decreased range of motion   OT Treatment/Interventions:        OT Goals(Current goals can be found in the care plan section)   Acute Rehab OT Goals Patient Stated Goal: none stated OT Goal Formulation: With patient Time For Goal Achievement: 07/19/24 Potential to Achieve Goals: Good   OT Frequency:       Co-evaluation              AM-PAC OT 6 Clicks Daily Activity     Outcome Measure Help from another person eating meals?: None Help from another person taking care of personal grooming?: None Help from another person toileting, which includes using toliet, bedpan, or urinal?: None Help from another person bathing (including washing, rinsing, drying)?: A Little Help from another person to put on and taking off regular upper body clothing?: None Help from another person to put on and taking off regular lower body clothing?: A Little 6 Click Score: 22   End of Session Equipment Utilized During Treatment: Back brace Nurse Communication: Mobility status  Activity Tolerance: Patient tolerated treatment well Patient left: in bed;with call bell/phone within reach;with family/visitor present  OT Visit Diagnosis: Muscle weakness (generalized) (M62.81)                Time: 8473-8442 OT Time Calculation (min): 31 min Charges:  OT General Charges $OT Visit: 1 Visit OT Evaluation $OT Eval Low Complexity: 1 Low OT Treatments $Self Care/Home Management : 8-22 mins  [name], OTD, OTR/L SecureChat Preferred Acute Rehab [phone]   [name] 07/05/2024, 4:11 PM

## 2024-07-05 NOTE — Anesthesia Procedure Notes (Signed)
 Procedure Name: Intubation Date/Time: 07/05/2024 10:26 AM  Performed by: Roslynn Waddell LABOR, CRNAPre-anesthesia Checklist: Patient identified, Emergency Drugs available, Suction available and Patient being monitored Patient Re-evaluated:Patient Re-evaluated prior to induction Oxygen Delivery Method: Circle System Utilized Preoxygenation: Pre-oxygenation with 100% oxygen Induction Type: IV induction Laryngoscope Size: Mac and 4 Grade View: Grade II Tube type: Oral Number of attempts: 1 Airway Equipment and Method: Stylet and Oral airway Placement Confirmation: ETT inserted through vocal cords under direct vision, positive ETCO2 and breath sounds checked- equal and bilateral Secured at: 23 cm Tube secured with: Tape Dental Injury: Teeth and Oropharynx as per pre-operative assessment  Comments: Atraumatic induction/intubation. Dentition and oral mucosa as per preop.

## 2024-07-05 NOTE — Plan of Care (Signed)
 Problem: Education: Goal: Knowledge of General Education information will improve Description: Including pain rating scale, medication(s)/side effects and non-pharmacologic comfort measures 07/05/2024 1825 by Sherleen Flor, RN Outcome: Completed/Met 07/05/2024 1435 by Sherleen Flor, RN Outcome: Progressing   Problem: Health Behavior/Discharge Planning: Goal: Ability to manage health-related needs will improve 07/05/2024 1825 by Sherleen Flor, RN Outcome: Completed/Met 07/05/2024 1435 by Sherleen Flor, RN Outcome: Progressing   Problem: Clinical Measurements: Goal: Ability to maintain clinical measurements within normal limits will improve 07/05/2024 1825 by Sherleen Flor, RN Outcome: Completed/Met 07/05/2024 1435 by Sherleen Flor, RN Outcome: Progressing Goal: Will remain free from infection 07/05/2024 1825 by Sherleen Flor, RN Outcome: Completed/Met 07/05/2024 1435 by Sherleen Flor, RN Outcome: Progressing Goal: Diagnostic test results will improve 07/05/2024 1825 by Sherleen Flor, RN Outcome: Completed/Met 07/05/2024 1435 by Sherleen Flor, RN Outcome: Progressing Goal: Respiratory complications will improve 07/05/2024 1825 by Sherleen Flor, RN Outcome: Completed/Met 07/05/2024 1435 by Sherleen Flor, RN Outcome: Progressing Goal: Cardiovascular complication will be avoided 07/05/2024 1825 by Sherleen Flor, RN Outcome: Completed/Met 07/05/2024 1435 by Sherleen Flor, RN Outcome: Progressing   Problem: Activity: Goal: Risk for activity intolerance will decrease 07/05/2024 1825 by Sherleen Flor, RN Outcome: Completed/Met 07/05/2024 1435 by Sherleen Flor, RN Outcome: Progressing   Problem: Nutrition: Goal: Adequate nutrition will be maintained 07/05/2024 1825 by Sherleen Flor, RN Outcome: Completed/Met 07/05/2024 1435 by Sherleen Flor, RN Outcome: Progressing   Problem: Coping: Goal: Level of anxiety will  decrease 07/05/2024 1825 by Sherleen Flor, RN Outcome: Completed/Met 07/05/2024 1435 by Sherleen Flor, RN Outcome: Progressing   Problem: Elimination: Goal: Will not experience complications related to bowel motility 07/05/2024 1825 by Sherleen Flor, RN Outcome: Completed/Met 07/05/2024 1435 by Sherleen Flor, RN Outcome: Progressing Goal: Will not experience complications related to urinary retention 07/05/2024 1825 by Sherleen Flor, RN Outcome: Completed/Met 07/05/2024 1435 by Sherleen Flor, RN Outcome: Progressing   Problem: Pain Managment: Goal: General experience of comfort will improve and/or be controlled 07/05/2024 1825 by Sherleen Flor, RN Outcome: Completed/Met 07/05/2024 1435 by Sherleen Flor, RN Outcome: Progressing   Problem: Safety: Goal: Ability to remain free from injury will improve 07/05/2024 1825 by Sherleen Flor, RN Outcome: Completed/Met 07/05/2024 1435 by Sherleen Flor, RN Outcome: Progressing   Problem: Skin Integrity: Goal: Risk for impaired skin integrity will decrease 07/05/2024 1825 by Sherleen Flor, RN Outcome: Completed/Met 07/05/2024 1435 by Sherleen Flor, RN Outcome: Progressing   Problem: Education: Goal: Ability to verbalize activity precautions or restrictions will improve 07/05/2024 1825 by Sherleen Flor, RN Outcome: Completed/Met 07/05/2024 1435 by Sherleen Flor, RN Outcome: Progressing Goal: Knowledge of the prescribed therapeutic regimen will improve 07/05/2024 1825 by Sherleen Flor, RN Outcome: Completed/Met 07/05/2024 1435 by Sherleen Flor, RN Outcome: Progressing Goal: Understanding of discharge needs will improve 07/05/2024 1825 by Sherleen Flor, RN Outcome: Completed/Met 07/05/2024 1435 by Sherleen Flor, RN Outcome: Progressing   Problem: Activity: Goal: Ability to avoid complications of mobility impairment will improve 07/05/2024 1825 by Sherleen Flor,  RN Outcome: Completed/Met 07/05/2024 1435 by Sherleen Flor, RN Outcome: Progressing Goal: Ability to tolerate increased activity will improve 07/05/2024 1825 by Sherleen Flor, RN Outcome: Completed/Met 07/05/2024 1435 by Sherleen Flor, RN Outcome: Progressing Goal: Will remain free from falls 07/05/2024 1825 by Sherleen Flor, RN Outcome: Completed/Met 07/05/2024 1435 by Sherleen Flor, RN Outcome: Progressing   Problem: Bowel/Gastric: Goal: Gastrointestinal status for postoperative course will improve 07/05/2024 1825 by Sherleen Flor, RN Outcome: Completed/Met 07/05/2024 1435 by Sherleen Flor, RN Outcome: Progressing   Problem: Clinical Measurements: Goal: Ability to  maintain clinical measurements within normal limits will improve 07/05/2024 1825 by Sherleen Flor, RN Outcome: Completed/Met 07/05/2024 1435 by Sherleen Flor, RN Outcome: Progressing Goal: Postoperative complications will be avoided or minimized 07/05/2024 1825 by Sherleen Flor, RN Outcome: Completed/Met 07/05/2024 1435 by Sherleen Flor, RN Outcome: Progressing Goal: Diagnostic test results will improve 07/05/2024 1825 by Sherleen Flor, RN Outcome: Completed/Met 07/05/2024 1435 by Sherleen Flor, RN Outcome: Progressing   Problem: Pain Management: Goal: Pain level will decrease 07/05/2024 1825 by Sherleen Flor, RN Outcome: Completed/Met 07/05/2024 1435 by Sherleen Flor, RN Outcome: Progressing   Problem: Skin Integrity: Goal: Will show signs of wound healing 07/05/2024 1825 by Sherleen Flor, RN Outcome: Completed/Met 07/05/2024 1435 by Sherleen Flor, RN Outcome: Progressing   Problem: Health Behavior/Discharge Planning: Goal: Identification of resources available to assist in meeting health care needs will improve 07/05/2024 1825 by Sherleen Flor, RN Outcome: Completed/Met 07/05/2024 1435 by Sherleen Flor, RN Outcome: Progressing   Problem:  Bladder/Genitourinary: Goal: Urinary functional status for postoperative course will improve 07/05/2024 1825 by Sherleen Flor, RN Outcome: Completed/Met 07/05/2024 1435 by Sherleen Flor, RN Outcome: Progressing

## 2024-07-05 NOTE — Anesthesia Preprocedure Evaluation (Addendum)
 Anesthesia Evaluation  Patient identified by MRN, date of birth, ID band Patient awake    Reviewed: Allergy & Precautions, NPO status , Patient's Chart, lab work & pertinent test results  Airway Mallampati: II  TM Distance: >3 FB Neck ROM: Full    Dental no notable dental hx.    Pulmonary    Pulmonary exam normal        Cardiovascular negative cardio ROS Normal cardiovascular exam     Neuro/Psych  PSYCHIATRIC DISORDERS Anxiety     TREMOR    GI/Hepatic negative GI ROS, Neg liver ROS,,,  Endo/Other  negative endocrine ROS    Renal/GU negative Renal ROS     Musculoskeletal   Abdominal   Peds  Hematology negative hematology ROS (+)   Anesthesia Other Findings Spinal stenosis, lumbar region with neurogenic claudication  Reproductive/Obstetrics                              Anesthesia Physical Anesthesia Plan  ASA: 2  Anesthesia Plan: General   Post-op Pain Management:    Induction: Intravenous  PONV Risk Score and Plan: 2 and Ondansetron , Dexamethasone , Midazolam  and Treatment may vary due to age or medical condition  Airway Management Planned: Oral ETT  Additional Equipment:   Intra-op Plan:   Post-operative Plan: Extubation in OR  Informed Consent: I have reviewed the patients History and Physical, chart, labs and discussed the procedure including the risks, benefits and alternatives for the proposed anesthesia with the patient or authorized representative who has indicated his/her understanding and acceptance.     Dental advisory given  Plan Discussed with: CRNA  Anesthesia Plan Comments:          Anesthesia Quick Evaluation

## 2024-07-05 NOTE — Progress Notes (Signed)

## 2024-07-05 NOTE — Progress Notes (Signed)
 Orthopedic Tech Progress Note Patient Details:  Darrell Fuller [birth year] [id number]  Ortho Devices Type of Ortho Device: Lumbar corsett Ortho Device/Splint Location: delivered to 3C02 per request from [facility], RN in PACU. 3C RN informed to call ortho techs should they need help fitting the brace when pt arrives. Ortho Device/Splint Interventions: Ordered      [name] 07/05/2024, 2:10 PM

## 2024-07-05 NOTE — Op Note (Signed)
 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  He has been advised regarding surgical intervention using an indirect decompression and a lateral plate fixation.  Procedure: The patient was brought to the operating room supine on the stretcher.  After the smooth induction of general endotracheal anesthesia he was placed in a right lateral decubitus position to secure orthogonal viewing using fluoroscopic guidance.  His body was taped in position across the hips and across the chest EMG monitoring was performed with needle electrodes being placed in the major muscle groups of the lumbar plexus.  Once he was secured in position orthogonally incision area was drawn on the skin on the left lateral aspect.  This area was then cleansed with alcohol DuraPrep and draped in a sterile fashion.  After infiltrating with 10 cc of lidocaine  with epinephrine  mixed 50-50 with half percent Marcaine  an incision was created and carried down through the subcutaneous tissues.  The lateral musculature was then dissected bluntly and an area just under the 11th rib was identified and using blunt dissection we entered the retroperitoneal space.  Then a narrow probe was placed over  the disc space at the level of L2-L3.  When this was verified radiographically and checked for EMG activity of the lumbar plexus none was noted a K wire was placed into the lateral aspect of the disc space at L2-L3.  Then a series of dilators was placed over this each time checking for electrical activity none was noted.  Ultimately a 140 mm deep retractor was placed into the wound over the largest dilator and secured to the operating table with a clamp.  Around the posterior blade electrical stimulation was performed to see if any elements of the upper portion lumbar plexus were there none were noted and then a Chane was placed into the disc space at the L2-L3 space.  The retractor was opened lighting sources were placed and the lateral slips of the iliopsoas muscle were divided over the disc space.  The space was then entered with a #15 blade and a series of curettes and rongeurs was used to evacuate substantial quantities of significantly degenerated desiccated disc material.  A large Cobb elevator was then used to cross the disc space and open the contralateral portion of the ligament once this was performed further discectomy was able to be easily performed and then the interspace was sized initially with an 8 x 18 mm spacer which fit easily into the gap.  This was gradually increased to a 10 x 22 mm spacer and ultimately a 12 x 22 mm spacer was felt that a 50 mm with would fit appropriately to give good decompression and reestablish height of the disc space with reduction of the retrolisthesis at L2-L3.  The endplates were then amply prepared and secured this was done with the help of Dr. Dorn Ned who worked from 1 side while I worked on the opposite side to clean the endplates very  thoroughly and provide a good decorticated surface for bony adhesion.  Once this was accomplished the spacer was then placed under fluoroscopic guidance into the interspace at L2-L3.  Radiographic confirmation of its  position was obtained and then a lateral plate was fixed at L2-L3 this was a 12 mm 4-hole plate with a 50 mm screw being placed in L3 a 45 mm screw being placed in L to both anteriorly and dorsally.  Once the screws were placed to the system was tightened to the appropriate torque specifications.  The area was inspected for hemostasis and the retractor was removed final radiographs were obtained in the AP and lateral projection identifying good position of the hardware.  With this the deep muscle was closed with a singular stitch to hold it together tied loosely and then the fascia was closed with 3-0 Vicryl in interrupted fashion 3-0 Vicryl was used in the subcuticular skin along with 4-0 Vicryl for the final subcuticular closure Dermabond was placed in the skin blood loss was estimated less than 50 cc.  Patient tolerated procedure well was returned to recovery room in stable condition.

## 2024-07-05 NOTE — Transfer of Care (Signed)
 Immediate Anesthesia Transfer of Care Note  Patient: Darrell Fuller  Procedure(s) Performed: ANTERIOR LATERAL LUMBAR TWO-THREE WITH LATERAL PLATE WITH INTERBODY FUSION  Patient Location: PACU  Anesthesia Type:General  Level of Consciousness: awake and alert   Airway & Oxygen Therapy: Patient Spontanous Breathing  Post-op Assessment: Report given to RN and Post -op Vital signs reviewed and stable  Post vital signs: Reviewed and stable  Last Vitals:  Vitals Value Taken Time  BP 124/75 07/05/24 13:00  Temp    Pulse 94 07/05/24 13:01  Resp 13 07/05/24 13:01  SpO2 95 % 07/05/24 13:01  Vitals shown include unfiled device data.  Last Pain:  Vitals:   07/05/24 0825  TempSrc:   PainSc: 4       Patients Stated Pain Goal: 2 (07/05/24 0825)  Complications: No notable events documented.

## 2024-07-05 NOTE — Discharge Summary (Signed)
 Physician Discharge Summary  Patient ID: Darrell Fuller MRN: [id number] DOB/AGE: 09-22-1960 63 y.o.  Admit date: 07/05/2024 Discharge date: 07/05/2024  Admission Diagnoses: Lumbar spinal stenosis L2-L3 with retrolisthesis L2-L3.  Neurogenic claudication.  Lumbar radiculopathy.  Discharge Diagnoses: Lumbar spinal stenosis L2-L3.  Retrolisthesis L2-L3.  Neurogenic claudication.  Lumbar radiculopathy. Principal Problem:   Lumbar stenosis with neurogenic claudication   Discharged Condition: good  Hospital Course: Patient tolerated surgery well  Consults: None  Significant Diagnostic Studies: None  Treatments: surgery: See op note  Discharge Exam: Blood pressure 139/61, pulse 80, temperature 97.9 F (36.6 C), resp. rate 17, height 5' 6 (1.676 m), weight 76.2 kg, SpO2 94%. Incision is clean and dry Station and gait are intact.  Disposition: Discharge disposition: 01-Home or Self Care       Discharge Instructions     Call MD for:  redness, tenderness, or signs of infection (pain, swelling, redness, odor or green/yellow discharge around incision site)   Complete by: As directed    Call MD for:  severe uncontrolled pain   Complete by: As directed    Call MD for:  temperature >100.4   Complete by: As directed    Diet - low sodium heart healthy   Complete by: As directed    Discharge wound care:   Complete by: As directed    Okay to shower. Do not apply salves or appointments to incision. No heavy lifting with the upper extremities greater than 10 pounds. May resume driving when not requiring pain medication and patient feels comfortable with doing so.   Incentive spirometry RT   Complete by: As directed    Increase activity slowly   Complete by: As directed       Allergies as of 07/05/2024       Reactions   Hydrocodone-acetaminophen  Other (See Comments)   Skin crawling   Prednisone Other (See Comments)   Irritability and insomnia   Morphine And Codeine Hives    Pravachol [pravastatin] Itching   Tramadol Itching   insomnia   Zocor [simvastatin] Itching, Rash        Medication List     TAKE these medications    methocarbamol  500 MG tablet Commonly known as: ROBAXIN  Take 1 tablet (500 mg total) by mouth every 6 (six) hours as needed for muscle spasms.   methylTESTOSTERone 10 MG capsule Commonly known as: ANDROID Take 20 mg by mouth in the morning.   NON FORMULARY Inject 1 Dose as directed daily. BPC-157 Peptide Daily into shoulder   oxyCODONE -acetaminophen  5-325 MG tablet Commonly known as: PERCOCET/ROXICET Take 1-2 tablets by mouth every 6 (six) hours as needed for moderate pain (pain score 4-6) or severe pain (pain score 7-10).   SERMORELIN ACETATE [place] Inject 1 Dose into the skin every Monday, Tuesday, Wednesday, Thursday, and Friday. 5 days on 2 days off   TESAMORELIN ACETATE  Inject 1 Dose into the skin every Monday, Tuesday, Wednesday, Thursday, and Friday. 5 days on 2 days off               Discharge Care Instructions  (From admission, onward)           Start     Ordered   07/05/24 0000  Discharge wound care:       Comments: Okay to shower. Do not apply salves or appointments to incision. No heavy lifting with the upper extremities greater than 10 pounds. May resume driving when not requiring pain medication and patient feels comfortable with  doing so.   07/05/24 1707             Signed: Victory PARAS Gable Odonohue 07/05/2024, 5:08 PM

## 2024-07-05 NOTE — H&P (Signed)
 Darrell Fuller is an 63 y.o. male.   Chief Complaint: Back and bilateral leg pain HPI: Patient is a 63 year old individual was previously undergone surgical decompression stabilization at L3-4 and L4-5.  He has developed adjacent level disease at L2-L3 with retrolisthesis of L2 on L3 and significant central canal stenosis.  He has been developing neurogenic claudication along with lumbar radiculopathy.  He has failed efforts at conservative management and was advised regarding surgical decompression using an anterolateral indirect decompression technique with lateral plate fixation.  He is now admitted for that surgery.  Past Medical History:  Diagnosis Date   Abnormal liver function test    Allergic rhinitis    Anxiety    Asthmatic bronchitis    Attention deficit hyperactivity disorder    DJD (degenerative joint disease)    GERD (gastroesophageal reflux disease)    Hyperlipidemia    Lumbar back pain    Scoliosis    Testosterone deficiency     Past Surgical History:  Procedure Laterality Date   FOOT SURGERY Right    left shoulder surgery  2003   LUMBAR SPINE SURGERY  04/2009   Dr. Duwayne   right shoulder surgery     Dr. Camella   varicocele repair and vasectomy     Dr. Renay Moats    Family History  Problem Relation Age of Onset   Heart disease Father    Crohn's disease Sister    Colon cancer Neg Hx    Stomach cancer Neg Hx    Asthma Neg Hx    Allergic rhinitis Neg Hx    Eczema Neg Hx    Urticaria Neg Hx    Immunodeficiency Neg Hx    Angioedema Neg Hx    Social History:  reports that he has never smoked. He has never used smokeless tobacco. He reports current alcohol use of about 1.0 standard drink of alcohol per week. He reports that he does not use drugs.  Allergies:  Allergies  Allergen Reactions   Hydrocodone-Acetaminophen  Other (See Comments)    Skin crawling     Prednisone Other (See Comments)    Irritability and insomnia   Morphine And Codeine Hives    Pravachol [Pravastatin] Itching   Tramadol Itching    insomnia   Zocor [Simvastatin] Itching and Rash    Medications Prior to Admission  Medication Sig Dispense Refill   methylTESTOSTERone (ANDROID) 10 MG capsule Take 20 mg by mouth in the morning.     NON FORMULARY Inject 1 Dose as directed daily. BPC-157 Peptide Daily into shoulder     SERMORELIN ACETATE [place] Inject 1 Dose into the skin every Monday, Tuesday, Wednesday, Thursday, and Friday. 5 days on 2 days off     TESAMORELIN ACETATE  Inject 1 Dose into the skin every Monday, Tuesday, Wednesday, Thursday, and Friday. 5 days on 2 days off      No results found for this or any previous visit (from the past 48 hours). No results found.  Review of Systems  Constitutional:  Positive for activity change.  Musculoskeletal:  Positive for back pain and gait problem.  All other systems reviewed and are negative.   Blood pressure (!) 145/82, pulse 70, temperature 98.2 F (36.8 C), temperature source Oral, resp. rate 17, height 5' 6 (1.676 m), weight 76.2 kg, SpO2 97%. Physical Exam Constitutional:      Appearance: Normal appearance. He is normal weight.  HENT:     Head: Normocephalic and atraumatic.     Right  Ear: Tympanic membrane, ear canal and external ear normal.     Left Ear: Tympanic membrane, ear canal and external ear normal.     Nose: Nose normal.     Mouth/Throat:     Mouth: Mucous membranes are moist.     Pharynx: Oropharynx is clear.  Eyes:     Extraocular Movements: Extraocular movements intact.     Conjunctiva/sclera: Conjunctivae normal.     Pupils: Pupils are equal, round, and reactive to light.  Cardiovascular:     Rate and Rhythm: Normal rate and regular rhythm.     Pulses: Normal pulses.     Heart sounds: Normal heart sounds.  Pulmonary:     Effort: Pulmonary effort is normal.     Breath sounds: Normal breath sounds.  Abdominal:     General: Abdomen is flat. Bowel sounds are normal.     Palpations:  Abdomen is soft.  Musculoskeletal:        General: Normal range of motion.     Cervical back: Normal range of motion and neck supple.  Skin:    General: Skin is warm and dry.  Neurological:     General: No focal deficit present.     Mental Status: He is alert.  Psychiatric:        Mood and Affect: Mood normal.        Behavior: Behavior normal.        Thought Content: Thought content normal.        Judgment: Judgment normal.      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  Plan: Anterolateral decompression with XLIF technique and lateral plate fixation O7-O6.  [name], MD 07/05/2024, 10:07 AM

## 2024-07-06 NOTE — Anesthesia Postprocedure Evaluation (Signed)
 Anesthesia Post Note  Patient: Darrell Fuller  Procedure(s) Performed: ANTERIOR LATERAL LUMBAR TWO-THREE WITH LATERAL PLATE WITH INTERBODY FUSION     Patient location during evaluation: PACU Anesthesia Type: General Level of consciousness: awake Pain management: pain level controlled Vital Signs Assessment: post-procedure vital signs reviewed and stable Respiratory status: spontaneous breathing, nonlabored ventilation and respiratory function stable Cardiovascular status: blood pressure returned to baseline and stable Postop Assessment: no apparent nausea or vomiting Anesthetic complications: no   No notable events documented.  Last Vitals:  Vitals:   07/05/24 1409 07/05/24 1718  BP: 139/61 (!) 159/76  Pulse: 80 77  Resp: 17 17  Temp: 36.6 C 37 C  SpO2: 94% 100%    Last Pain:  Vitals:   07/05/24 1330  TempSrc:   PainSc: 6                  Trishelle Devora P Erikah Thumm

## 2024-07-09 ENCOUNTER — Encounter (HOSPITAL_COMMUNITY): Payer: Self-pay | Admitting: Neurological Surgery

## 2024-07-24 ENCOUNTER — Other Ambulatory Visit (HOSPITAL_BASED_OUTPATIENT_CLINIC_OR_DEPARTMENT_OTHER): Payer: Self-pay | Admitting: Physician Assistant

## 2024-07-24 DIAGNOSIS — E785 Hyperlipidemia, unspecified: Secondary | ICD-10-CM

## 2024-08-08 ENCOUNTER — Inpatient Hospital Stay (HOSPITAL_BASED_OUTPATIENT_CLINIC_OR_DEPARTMENT_OTHER)
Admission: RE | Admit: 2024-08-08 | Discharge: 2024-08-08 | Attending: Physician Assistant | Admitting: Physician Assistant

## 2024-08-08 DIAGNOSIS — E785 Hyperlipidemia, unspecified: Secondary | ICD-10-CM | POA: Insufficient documentation

## 2024-08-09 ENCOUNTER — Other Ambulatory Visit: Payer: Self-pay | Admitting: Neurological Surgery

## 2024-08-09 DIAGNOSIS — M48062 Spinal stenosis, lumbar region with neurogenic claudication: Secondary | ICD-10-CM

## 2024-08-21 ENCOUNTER — Ambulatory Visit
Admission: RE | Admit: 2024-08-21 | Discharge: 2024-08-21 | Disposition: A | Discharge status: Home / Self Care | Source: Ambulatory Visit | Attending: Neurological Surgery | Admitting: Neurological Surgery

## 2024-08-21 ENCOUNTER — Other Ambulatory Visit

## 2024-08-21 DIAGNOSIS — M48062 Spinal stenosis, lumbar region with neurogenic claudication: Secondary | ICD-10-CM

## 2024-08-21 MED ORDER — GADOPICLENOL 0.5 MMOL/ML IV SOLN
7.5000 mL | Freq: Once | INTRAVENOUS | Status: AC | PRN
  Administered 2024-08-21: 7.5 mL via INTRAVENOUS

## 2024-08-28 ENCOUNTER — Encounter: Payer: Self-pay | Admitting: Gastroenterology

## 2024-08-29 ENCOUNTER — Ambulatory Visit: Admitting: Gastroenterology

## 2024-08-29 ENCOUNTER — Encounter: Payer: Self-pay | Admitting: Gastroenterology

## 2024-08-29 VITALS — BP 140/82 | HR 68 | Ht 66.0 in | Wt 172.0 lb

## 2024-08-29 DIAGNOSIS — Z1211 Encounter for screening for malignant neoplasm of colon: Secondary | ICD-10-CM

## 2024-08-29 DIAGNOSIS — K625 Hemorrhage of anus and rectum: Secondary | ICD-10-CM

## 2024-08-29 MED ORDER — NA SULFATE-K SULFATE-MG SULF 17.5-3.13-1.6 GM/177ML PO SOLN: 1.0000 | ORAL | 0 refills | Status: AC

## 2024-08-29 NOTE — Patient Instructions (Signed)
 We have sent the following medications to your pharmacy for you to pick up at your convenience: Suprep   You have been scheduled for a colonoscopy. Please follow written instructions given to you at your visit today.   If you use inhalers (even only as needed), please bring them with you on the day of your procedure.  DO NOT TAKE 7 DAYS PRIOR TO TEST- Trulicity (dulaglutide) Ozempic, Wegovy (semaglutide) Mounjaro, Zepbound (tirzepatide) Bydureon Bcise (exanatide extended release)  DO NOT TAKE 1 DAY PRIOR TO YOUR TEST Rybelsus (semaglutide) Adlyxin (lixisenatide) Victoza (liraglutide) Byetta (exanatide) ___________________________________________________________________________   _______________________________________________________  If your blood pressure at your visit was 140/90 or greater, please contact your primary care physician to follow up on this.  _______________________________________________________  If you are age 65 or older, your body mass index should be between 23-30. Your Body mass index is 27.76 kg/m. If this is out of the aforementioned range listed, please consider follow up with your Primary Care Provider.  If you are age 65 or younger, your body mass index should be between 19-25. Your Body mass index is 27.76 kg/m. If this is out of the aformentioned range listed, please consider follow up with your Primary Care Provider.   ________________________________________________________  The [facility] providers would like to encourage you to use MYCHART to communicate with providers for non-urgent requests or questions.  Due to long hold times on the telephone, sending your provider a message by [facility] may be a faster and more efficient way to get a response.  Please allow 48 business hours for a response.  Please remember that this is for non-urgent requests.  _______________________________________________________  [facility] is using a  team-based approach to care.  Your team is made up of your doctor and two to three APPS. Our APPS (Nurse Practitioners and Physician Assistants) work with your physician to ensure care continuity for you. They are fully qualified to address your health concerns and develop a treatment plan. They communicate directly with your gastroenterologist to care for you. Seeing the Advanced Practice Practitioners on your physician's team can help you by facilitating care more promptly, often allowing for earlier appointments, access to diagnostic testing, procedures, and other specialty referrals.   Due to recent changes in healthcare laws, you may see the results of your imaging and laboratory studies on MyChart before your provider has had a chance to review them.  We understand that in some cases there may be results that are confusing or concerning to you. Not all laboratory results come back in the same time frame and the provider may be waiting for multiple results in order to interpret others.  Please give us  48 hours in order for your provider to thoroughly review all the results before contacting the office for clarification of your results.   Thank you for choosing me and [facility].  Dr. Victory Brand

## 2024-08-29 NOTE — Progress Notes (Signed)
 "     [facility] Consult Note:  History: Darrell Fuller 08/29/2024  Referring provider: Nena Rosina CROME, PA-C  Reason for consult/chief complaint: Rectal Bleeding (Since having back surgery, he has been having some rectal bleeding. It is bright red and sometimes watery. Having some constipation and nausea but not often.)   Subjective  Prior history:  Normal colonoscopy with Dr Aneita Dec 2013   History of Present Illness  This is a very pleasant 64 year old man last here for screening colonoscopy in 2013 now presenting with recent rectal bleeding.  It began after a period of constipation recovering from lower back surgery in mid November 2025.  Constipation resolved and he is back to his usual pattern of 1-2 formed (but pasty) BMs a day.  Started having some blood on the paper and in the toilet bowl during the initial constipation, then it has waxed and waned but been somewhat worse lately with a large volume of blood.  No diarrhea, no abdominal pain and no chronic upper digestive symptoms such as heartburn dysphagia loss of appetite nausea or vomiting. His sister has had multiple surgeries for Crohn's disease  ROS:  Review of Systems  Constitutional:  Negative for appetite change and unexpected weight change.  HENT:  Negative for mouth sores and voice change.   Eyes:  Negative for pain and redness.  Respiratory:  Negative for cough and shortness of breath.   Cardiovascular:  Negative for chest pain and palpitations.  Genitourinary:  Negative for dysuria and hematuria.  Musculoskeletal:  Negative for arthralgias and myalgias.  Skin:  Negative for pallor and rash.  Neurological:  Negative for weakness and headaches.  Hematological:  Negative for adenopathy.     Past Medical History: Past Medical History:  Diagnosis Date   Abnormal liver function test    Allergic rhinitis    Anxiety    Asthmatic bronchitis    Attention deficit hyperactivity disorder    DJD  (degenerative joint disease)    GERD (gastroesophageal reflux disease)    Hyperlipidemia    Lumbar back pain    Scoliosis    Testosterone deficiency      Past Surgical History: Past Surgical History:  Procedure Laterality Date   ANTERIOR LAT LUMBAR FUSION N/A 07/05/2024   Procedure: ANTERIOR LATERAL LUMBAR TWO-THREE WITH LATERAL PLATE WITH INTERBODY FUSION;  Surgeon: Colon Shove, MD;  Location: [facility] OR;  Service: Neurosurgery;  Laterality: N/A;  ANTERIOR LATERAL LUMBAR TWO-THREE WITH LATERAL PLATE WITH INTERBODY FUSION   FOOT SURGERY Right    left shoulder surgery  2003   LUMBAR SPINE SURGERY  04/2009   Dr. Duwayne   right shoulder surgery     Dr. Camella   varicocele repair and vasectomy     Dr. Renay Moats     Family History: Family History  Problem Relation Age of Onset   Heart disease Father    Crohn's disease Sister    Colon cancer Neg Hx    Stomach cancer Neg Hx    Asthma Neg Hx    Allergic rhinitis Neg Hx    Eczema Neg Hx    Urticaria Neg Hx    Immunodeficiency Neg Hx    Angioedema Neg Hx     Social History: Social History   Socioeconomic History   Marital status: Married    Spouse name: Not on file   Number of children: 2   Years of education: Not on file   Highest education level: Not on file  Occupational  History   Occupation: Designer, Fashion/clothing: [employer]  Tobacco Use   Smoking status: Never   Smokeless tobacco: Never  Vaping Use   Vaping status: Never Used  Substance and Sexual Activity   Alcohol use: Yes    Alcohol/week: 1.0 standard drink of alcohol    Types: 1 Standard drinks or equivalent per week    Comment: 1-2 beers a week   Drug use: No   Sexual activity: Not Currently  Other Topics Concern   Not on file  Social History Narrative   6 children, 2 biological, 2 adopted, 2 step children   Social Drivers of Health   Tobacco Use: Low Risk (08/29/2024)   Patient History    Smoking Tobacco Use: Never    Smokeless Tobacco Use: Never     Passive Exposure: Not on file  Financial Resource Strain: Not on file  Food Insecurity: No Food Insecurity (09/15/2021)   [facility]   Epic    Within the past 12 months, you worried that your food would run out before you got the money to buy more.: Never true    Within the past 12 months, the food you bought just didn't last and you didn't have money to get more.: Never true  Transportation Needs: Not on file  Physical Activity: Not on file  Stress: Not on file  Social Connections: Not on file  Depression (EYV7-0): Not on file  Alcohol Screen: Not on file  Housing: Unknown (12/15/2023)   Received from [facility]   Epic    At any time in the past 12 months, were you homeless or living in a shelter (including now)?: No    Number of Times Moved in the Last Year: Not on file    Unable to Pay for Housing in the Last Year: Not on file  Utilities: Not on file  Health Literacy: Not on file   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 30 tablet 2   methylTESTOSTERone (ANDROID) 10 MG capsule Take 20 mg by mouth in the morning.     Na Sulfate-K Sulfate-Mg Sulfate concentrate (SUPREP BOWEL PREP KIT) 17.5-3.13-1.6 GM/177ML SOLN Take 1 kit (354 mLs total) by mouth as directed. For colonoscopy prep 354 mL 0   NON FORMULARY Inject 1 Dose as directed daily. BPC-157 Peptide Daily into shoulder     oxyCODONE -acetaminophen  (PERCOCET/ROXICET) 5-325 MG tablet Take 1-2 tablets by mouth every 6 (six) hours as needed for moderate pain (pain score 4-6) or severe pain (pain score 7-10). 30 tablet 0   SERMORELIN ACETATE [place] Inject 1 Dose into the skin every Monday, Tuesday, Wednesday, Thursday, and  Friday. 5 days on 2 days off     TESAMORELIN ACETATE [place] Inject 1 Dose into the skin every Monday, Tuesday, Wednesday, Thursday, and Friday. 5 days on 2 days off     No current facility-administered medications for this visit.      ___________________________________________________________________ Objective   Exam:  BP (!) 140/82   Pulse 68   Ht 5' 6 (1.676 m)   Wt 172 lb (78 kg)   SpO2 97%   BMI 27.76 kg/m  Wt Readings from Last 3 Encounters:  08/29/24 172 lb (78 kg)  07/05/24 168 lb (76.2 kg)  07/02/24 173 lb 1.6 oz (78.5 kg)    General: Well-appearing Eyes: sclera anicteric, no redness ENT: oral mucosa moist without lesions, no cervical or supraclavicular lymphadenopathy CV: Regular without appreciable murmur, no JVD, no peripheral edema Resp: clear to auscultation bilaterally, normal RR and effort noted GI: soft, no tenderness, with active bowel sounds. No guarding or palpable organomegaly noted. Skin; warm and dry, no rash or jaundice noted Neuro: awake, alert and oriented x 3. Normal gross motor function and fluent speech Rectal exam deferred (will have during colonoscopy)  Labs:     Latest Ref Rng & Units 07/02/2024    2:40 PM 08/27/2021    8:23 AM 03/26/2021    1:00 PM  CBC  WBC 4.0 - 10.5 K/uL 5.9  5.6  5.6   Hemoglobin 13.0 - 17.0 g/dL 84.6  84.4  85.2   Hematocrit 39.0 - 52.0 % 46.0  46.3  43.5   Platelets 150 - 400 K/uL 174  176  154       Latest Ref Rng & Units 07/02/2024    2:40 PM 02/18/2021   10:08 AM 07/03/2018    8:49 AM  CMP  Glucose 70 - 99 mg/dL 88  98    BUN 8 - 23 mg/dL 20  14    Creatinine 9.38 - 1.24 mg/dL 9.01  9.05    Sodium 864 - 145 mmol/L 140  138    Potassium 3.5 - 5.1 mmol/L 3.9  4.2    Chloride 98 - 111 mmol/L 104  103    CO2 22 - 32 mmol/L 26  28    Calcium  8.9 - 10.3 mg/dL 8.7  9.0    Total Protein 6.0 - 8.3 g/dL  7.1  6.8   Total Bilirubin 0.2 - 1.2 mg/dL  0.6  0.4   Alkaline Phos 39 - 117 U/L  69  80   AST 0 - 37 U/L   24  27   ALT 0 - 53 U/L  29  27      Encounter Diagnoses  Name Primary?   Rectal bleeding Yes   Special screening for malignant neoplasms, colon     Assessment & Plan   2 months rectal bleeding, began during post surgical constipation.  Most likely hemorrhoidal (painless bleeding that began during constipation), no abdominal pain or diarrhea to suggest IBD (despite family history), less likely neoplasia.  Overdue for colorectal cancer screening   Plan:  Recommended twice daily Preparation H suppository when bleeding occurs, use for 2 or 3 days at a time.  Screening colonoscopy.  He was agreeable after discussion of procedure and risks.  The benefits and risks of the planned procedure(s) were described in detail with the patient or (when appropriate) their health care proxy.  Risks were outlined as including, but not limited to, bleeding, infection, perforation, adverse medication reaction leading to cardiac or pulmonary decompensation, pancreatitis (if ERCP).  The limitation of incomplete mucosal visualization was also discussed.  No guarantees or warranties were given.  If hemorrhoids confirm the colonoscopy, most likely hemorrhoidal banding in office to follow (had an initial discussion about hemorrhoids and banding using diagrams of the anatomy).  Thank you for the courtesy of this consult.  Please call me with any questions or concerns.  Victory LITTIE Brand III  CC: Referring provider noted above     [1]  Allergies Allergen Reactions   Hydrocodone-Acetaminophen  Other (See Comments)  Skin crawling     Prednisone Other (See Comments)    Irritability and insomnia   Morphine And Codeine Hives   Pravachol [Pravastatin] Itching   Tramadol Itching    insomnia   Zocor [Simvastatin] Itching and Rash   "

## 2024-09-10 ENCOUNTER — Encounter: Payer: Self-pay | Admitting: Gastroenterology

## 2024-09-10 ENCOUNTER — Ambulatory Visit: Admitting: Gastroenterology

## 2024-09-10 VITALS — BP 128/78 | HR 69 | Temp 97.4°F | Resp 14 | Ht 66.0 in | Wt 172.0 lb

## 2024-09-10 DIAGNOSIS — K648 Other hemorrhoids: Secondary | ICD-10-CM | POA: Diagnosis not present

## 2024-09-10 DIAGNOSIS — Z1211 Encounter for screening for malignant neoplasm of colon: Secondary | ICD-10-CM

## 2024-09-10 DIAGNOSIS — K5140 Inflammatory polyps of colon without complications: Secondary | ICD-10-CM

## 2024-09-10 DIAGNOSIS — D123 Benign neoplasm of transverse colon: Secondary | ICD-10-CM

## 2024-09-10 MED ORDER — SODIUM CHLORIDE 0.9 % IV SOLN: 500.0000 mL | INTRAVENOUS | Status: DC

## 2024-09-10 NOTE — Progress Notes (Signed)
 No significant changes to clinical history since GI office visit on 08/29/24.  The patient is appropriate for an endoscopic procedure in the ambulatory setting.  - Victory Brand, MD

## 2024-09-10 NOTE — Patient Instructions (Addendum)
 - 1 polyp removed and internal hemorrhoids - Resume previous diet. - Continue present medications. - Await pathology results. - Repeat colonoscopy is recommended for      surveillance. The colonoscopy date will be      determined after pathology results from today's      exam become available for review. - Follow up at office if hemorrhoidal bleeding      continues and banding desired.     (see recent office note)  YOU HAD AN ENDOSCOPIC PROCEDURE TODAY AT THE  ENDOSCOPY CENTER:   Refer to the procedure report that was given to you for any specific questions about what was found during the examination.  If the procedure report does not answer your questions, please call your gastroenterologist to clarify.  If you requested that your care partner not be given the details of your procedure findings, then the procedure report has been included in a sealed envelope for you to review at your convenience later.  YOU SHOULD EXPECT: Some feelings of bloating in the abdomen. Passage of more gas than usual.  Walking can help get rid of the air that was put into your GI tract during the procedure and reduce the bloating. If you had a lower endoscopy (such as a colonoscopy or flexible sigmoidoscopy) you may notice spotting of blood in your stool or on the toilet paper. If you underwent a bowel prep for your procedure, you may not have a normal bowel movement for a few days.  Please Note:  You might notice some irritation and congestion in your nose or some drainage.  This is from the oxygen used during your procedure.  There is no need for concern and it should clear up in a day or so.  SYMPTOMS TO REPORT IMMEDIATELY:  Following lower endoscopy (colonoscopy or flexible sigmoidoscopy):  Excessive amounts of blood in the stool  Significant tenderness or worsening of abdominal pains  Swelling of the abdomen that is new, acute  Fever of 100F or higher   For urgent or emergent issues, a  gastroenterologist can be reached at any hour by calling [phone]. Do not use MyChart messaging for urgent concerns.    DIET:  We do recommend a small meal at first, but then you may proceed to your regular diet.  Drink plenty of fluids but you should avoid alcoholic beverages for 24 hours.  ACTIVITY:  You should plan to take it easy for the rest of today and you should NOT DRIVE or use heavy machinery until tomorrow (because of the sedation medicines used during the test).    FOLLOW UP: Our staff will call the number listed on your records the next business day following your procedure.  We will call around 7:15- 8:00 am to check on you and address any questions or concerns that you may have regarding the information given to you following your procedure. If we do not reach you, we will leave a message.     If any biopsies were taken you will be contacted by phone or by letter within the next 1-3 weeks.  Please call us  at [phone] if you have not heard about the biopsies in 3 weeks.    SIGNATURES/CONFIDENTIALITY: You and/or your care partner have signed paperwork which will be entered into your electronic medical record.  These signatures attest to the fact that that the information above on your After Visit Summary has been reviewed and is understood.  Full responsibility of the confidentiality of  this discharge information lies with you and/or your care-partner.

## 2024-09-10 NOTE — Progress Notes (Signed)
 To pacu, VSS. Report to [email or web address]

## 2024-09-10 NOTE — Op Note (Signed)
 [facility] Patient Name: Darrell Fuller Procedure Date: 09/10/2024 8:37 AM MRN: [id number] Endoscopist: Victory L. Legrand , MD, [id number] Age: 64 Referring MD:  Date of Birth: 12-24-1960 Gender: Male Account #: [id number] Procedure:                Colonoscopy Indications:              Screening for colorectal malignant neoplasm                           no polyps on last screening colonoscopy in 2013 Medicines:                Monitored Anesthesia Care Procedure:                Pre-Anesthesia Assessment:                           - Prior to the procedure, a History and Physical                            was performed, and patient medications and                            allergies were reviewed. The patient's tolerance of                            previous anesthesia was also reviewed. The risks                            and benefits of the procedure and the sedation                            options and risks were discussed with the patient.                            All questions were answered, and informed consent                            was obtained. Prior Anticoagulants: The patient has                            taken no anticoagulant or antiplatelet agents. ASA                            Grade Assessment: II - A patient with mild systemic                            disease. After reviewing the risks and benefits,                            the patient was deemed in satisfactory condition to                            undergo the procedure.  After obtaining informed consent, the colonoscope                            was passed under direct vision. Throughout the                            procedure, the patient's blood pressure, pulse, and                            oxygen saturations were monitored continuously. The                            CF [id number] #[id number] was introduced through the anus                            and advanced to the  the cecum, identified by                            appendiceal orifice and ileocecal valve. The                            colonoscopy was performed without difficulty. The                            patient tolerated the procedure well. The quality                            of the bowel preparation was excellent. The                            ileocecal valve, appendiceal orifice, and rectum                            were photographed. Scope In: 8:41:41 AM Scope Out: 8:53:57 AM Scope Withdrawal Time: 0 hours 10 minutes 11 seconds  Total Procedure Duration: 0 hours 12 minutes 16 seconds  Findings:                 The perianal and digital rectal examinations were                            normal.                           Repeat examination of right colon under NBI                            performed.                           A 5 mm polyp was found in the proximal transverse                            colon. The polyp was pedunculated. The polyp was  removed with a hot snare. Resection and retrieval                            were complete.                           Internal hemorrhoids were found. The hemorrhoids                            were small (primarily RP column).                           The exam was otherwise without abnormality on                            direct and retroflexion views. Complications:            No immediate complications. Estimated Blood Loss:     Estimated blood loss: none. Impression:               - One 5 mm polyp in the proximal transverse colon,                            removed with a hot snare. Resected and retrieved.                           - Internal hemorrhoids.                           - The examination was otherwise normal on direct                            and retroflexion views. Recommendation:           - Patient has a contact number available for                            emergencies. The signs and  symptoms of potential                            delayed complications were discussed with the                            patient. Return to normal activities tomorrow.                            Written discharge instructions were provided to the                            patient.                           - Resume previous diet.                           - Continue present medications.                           -  Await pathology results.                           - Repeat colonoscopy is recommended for                            surveillance. The colonoscopy date will be                            determined after pathology results from today's                            exam become available for review.                           - Follow up at office if hemorrhoidal banding                            continues and banding desired.                           (see recent office note) Macey Wurtz L. Legrand, MD 09/10/2024 9:01:02 AM This report has been signed electronically.

## 2024-09-10 NOTE — Progress Notes (Signed)
 Pt's states no medical or surgical changes since previsit or office visit.

## 2024-09-10 NOTE — Progress Notes (Signed)
 Called to room to assist during endoscopic procedure.  Patient ID and intended procedure confirmed with present staff. Received instructions for my participation in the procedure from the performing physician.

## 2024-09-11 ENCOUNTER — Telehealth: Payer: Self-pay

## 2024-09-11 NOTE — Telephone Encounter (Signed)
" °  Follow up Call-     09/10/2024    7:48 AM  Call back number  Post procedure Call Back phone  # [phone]  Permission to leave phone message Yes     Patient questions:  Do you have a fever, pain , or abdominal swelling? No. Pain Score  0 *  Have you tolerated food without any problems? Yes.    Have you been able to return to your normal activities? Yes.    Do you have any questions about your discharge instructions: Diet   No. Medications  No. Follow up visit  No.  Do you have questions or concerns about your Care? No.  Actions: * If pain score is 4 or above: No action needed, pain <4.   "

## 2024-09-12 ENCOUNTER — Ambulatory Visit: Payer: Self-pay | Admitting: Gastroenterology

## 2024-09-12 LAB — SURGICAL PATHOLOGY

## 2024-09-14 ENCOUNTER — Ambulatory Visit: Attending: Cardiology | Admitting: Cardiology

## 2024-09-14 ENCOUNTER — Other Ambulatory Visit: Payer: Self-pay | Admitting: *Deleted

## 2024-09-14 VITALS — BP 134/68 | HR 80 | Ht 66.0 in | Wt 172.0 lb

## 2024-09-14 DIAGNOSIS — Z79899 Other long term (current) drug therapy: Secondary | ICD-10-CM

## 2024-09-14 DIAGNOSIS — I2510 Atherosclerotic heart disease of native coronary artery without angina pectoris: Secondary | ICD-10-CM | POA: Diagnosis not present

## 2024-09-14 DIAGNOSIS — I2583 Coronary atherosclerosis due to lipid rich plaque: Secondary | ICD-10-CM

## 2024-09-14 DIAGNOSIS — E782 Mixed hyperlipidemia: Secondary | ICD-10-CM | POA: Diagnosis not present

## 2024-09-14 MED ORDER — METOPROLOL TARTRATE 50 MG PO TABS: 50.0000 mg | ORAL_TABLET | ORAL | 0 refills | Status: AC

## 2024-09-14 MED ORDER — REPATHA SURECLICK 140 MG/ML ~~LOC~~ SOAJ: 140.0000 mg | SUBCUTANEOUS | 6 refills | Status: AC

## 2024-09-14 MED ORDER — ASPIRIN 81 MG PO TBEC: 81.0000 mg | DELAYED_RELEASE_TABLET | Freq: Every day | ORAL | Status: AC

## 2024-09-14 NOTE — Progress Notes (Signed)
 " Cardiology Office Note:  .   Date:  09/14/2024  ID:  Darrell Fuller 1960/12/30, MRN [id number] PCP: Darrell Fuller  [facility] Providers Cardiologist:  Oneil Parchment, MD     History of Present Illness: .   Darrell Fuller is a 64 y.o. male Discussed the use of AI scribe   History of Present Illness Darrell Fuller is a 64 year old male with coronary artery disease who presents for establishment of care.   Coronary artery disease and lipid management - Coronary artery calcium  score of 438 AU, 82nd percentile for age - Most recent lipid panel: LDL 208 mg/dL, total cholesterol 735 mg/dL, triglycerides 70 mg/dL, HDL 45 mg/dL - Previously used Repatha , which lowered LDL to 53 mg/dL; currently not on cholesterol-lowering medications - Concerned about statin side effects, particularly muscle pain, memory - Interested in non-statin alternatives for cholesterol management. In fact he has tried all of the statins and is not tolerant of them in the past.  Cardiac symptoms and diagnostic testing - History of palpitations and precordial pain - Nuclear stress test in 2019 and echo stress test on August 19, 2022, both normal - Echocardiogram in 2022 showed ejection fraction of 65% with stage two diastolic dysfunction - No current chest pain or shortness of breath  Hormonal therapy and lifestyle modification - Currently on testosterone therapy - Uses peptides including sermorelin and tesamorelin for muscle building and fat reduction - On carnivore diet for two years, resulting in 40-pound weight loss - Engages in daily workouts and walks five miles a day  Genetic risk for cardiovascular disease - Family history significant for cardiovascular disease - Father had 13 heart attacks and a triple bypass - Other male relatives with history of heart attacks - Concerned about genetic predisposition to heart disease       Studies Reviewed: SABRA   EKG  Interpretation Date/Time:  Friday September 14 2024 14:30:03 EST Ventricular Rate:  80 PR Interval:  160 QRS Duration:  134 QT Interval:  412 QTC Calculation: 475 R Axis:   -47  Text Interpretation: Normal sinus rhythm Right bundle branch block Left anterior fascicular block Bifascicular block When compared with ECG of 19-Jan-2013 11:48, (RBBB and left anterior fascicular block) is now Present Confirmed by Parchment Oneil ([id number]) on 09/14/2024 2:57:32 PM    Results Labs Creatinine: 0.95 Potassium: 4.2 LDL: 141 Total cholesterol (06/2024): 264 Triglycerides (06/2024): 70 HDL (06/2024): 45 LDL (06/2024): 200  Radiology Coronary artery calcium  score (CT): Calcium  score 436 Agatston units, 82nd percentile for age, calcification in left anterior descending and left circumflex arteries, minimal calcification in right coronary artery (Independently interpreted)  Diagnostic Nuclear stress test (2019): Within normal limits Echocardiogram (2022): Ejection fraction 65%, stage 2 diastolic dysfunction Echocardiogram stress test (08/19/2022): Within normal limits Risk Assessment/Calculations:            Physical Exam:   VS:  BP 134/68 (BP Location: Left Arm, Patient Position: Sitting, Cuff Size: Normal)   Pulse 80   Ht 5' 6 (1.676 m)   Wt 172 lb (78 kg)   SpO2 96%   BMI 27.76 kg/m    Wt Readings from Last 3 Encounters:  09/14/24 172 lb (78 kg)  09/10/24 172 lb (78 kg)  08/29/24 172 lb (78 kg)    GEN: Well nourished, well developed in no acute distress NECK: No JVD; No carotid bruits CARDIAC: RRR, soft systolic murmur, no rubs, no gallops RESPIRATORY:  Clear to auscultation  without rales, wheezing or rhonchi  ABDOMEN: Soft, non-tender, non-distended EXTREMITIES:  No edema; No deformity   ASSESSMENT AND PLAN: .    Assessment and Plan Assessment & Plan Coronary artery disease Confirmed by calcium  score of 436 AU, indicating increased risk for future cardiovascular events.   Personally reviewed and interpreted his study and demonstrated images to him in real-time.  Calcification present in the left anterior descending artery and right coronary artery. No current symptoms of angina or dyspnea. Discussed the role of calcification in stabilizing plaque but also the risk of soft plaque rupture leading to myocardial infarction. Explained the potential for coronary CT scan with contrast to assess for significant stenosis. - Ordered coronary CT scan with contrast to assess for significant stenosis. - Initiated Repatha  140 mg every 14 days for cholesterol management. - Will schedule lipid panel in 3 months. - Will schedule follow-up in 1 year.  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 Previous statin therapy was not tolerated due to muscle pain. Discussed the use of Repatha  as an alternative to statins, highlighting its efficacy in lowering LDL and stabilizing plaque. Addressed concerns about the impact of cholesterol-lowering medications on brain function, noting no evidence of cognitive decline with statin use. Discussed the potential benefits of Repatha  in reducing soft plaque and stabilizing existing plaque. - Initiated Repatha  140 mg every 14 days. - Will schedule lipid panel in 3 months. - Will schedule follow-up in 1 year.         Dispo: 1 yr  Signed, Oneil Parchment, MD  "

## 2024-09-14 NOTE — Patient Instructions (Signed)
 Medication Instructions:  Please start Aspirin 81 mg a day and Repatha  as ordered once every 2 weeks. Continue all other medications as listed.  *If you need a refill on your cardiac medications before your next appointment, please call your pharmacy*  Lab Work: Please have blood work in 3 months (lipid panel) at your closest [facility].  If you have labs (blood work) drawn today and your tests are completely normal, you will receive your results only by: MyChart Message (if you have MyChart) OR A paper copy in the mail If you have any lab test that is abnormal or we need to change your treatment, we will call you to review the results.  Testing/Procedures:   Your cardiac CT will be scheduled at:  [facility] [address]  [place], [place] [address]  Please enter the parking lot using the [address] entrance and use the FREE valet service at the patient drop-off area. Enter the building and check-in with registration on the main floor.  Please follow these instructions carefully (unless otherwise directed):  An IV will be required for this test and Nitroglycerin will be given.  Hold all erectile dysfunction medications at least 3 days (72 hrs) prior to test. (Ie viagra, cialis, sildenafil, tadalafil, etc)   On the Night Before the Test: Be sure to Drink plenty of water. Do not consume any caffeinated/decaffeinated beverages or chocolate 12 hours prior to your test. Do not take any antihistamines 12 hours prior to your test.  On the Day of the Test: Drink plenty of water until 1 hour prior to the test. Do not eat any food 1 hour prior to test. You may take your regular medications prior to the test.  Take metoprolol (Lopressor) two hours prior to test. If you take Furosemide/Hydrochlorothiazide/Spironolactone/Chlorthalidone, please HOLD on the morning of the test. Patients who wear a continuous glucose monitor MUST remove the device prior to  scanning.      After the Test: Drink plenty of water. After receiving IV contrast, you may experience a mild flushed feeling. This is normal. On occasion, you may experience a mild rash up to 24 hours after the test. This is not dangerous. If this occurs, you can take Benadryl  25 mg, Zyrtec , Claritin , or Allegra and increase your fluid intake. (Patients taking Tikosyn should avoid Benadryl , and may take Zyrtec , Claritin , or Allegra) If you experience trouble breathing, this can be serious. If it is severe call 911 IMMEDIATELY. If it is mild, please call our office.  We will call to schedule your test 2-4 weeks out understanding that some insurance companies will need an authorization prior to the service being performed.   For more information and frequently asked questions, please visit our website : [email or web address]  For non-scheduling related questions, please contact the cardiac imaging nurse navigator should you have any questions/concerns: Cardiac Imaging Nurse Navigators Direct Office Dial: [phone]   For scheduling needs, including cancellations and rescheduling, please call Brittany, [phone].  Follow-Up: At [facility], you and your health needs are our priority.  As part of our continuing mission to provide you with exceptional heart care, our providers are all part of one team.  This team includes your primary Cardiologist (physician) and Advanced Practice Providers or APPs (Physician Assistants and Nurse Practitioners) who all work together to provide you with the care you need, when you need it.  Your next appointment:   1 year(s)  Provider:   Oneil Parchment, MD  We recommend signing up for the patient portal called MyChart.  Sign up information is provided on this After Visit Summary.  MyChart is used to connect with patients for Virtual Visits (Telemedicine).  Patients are able to view lab/test results, encounter notes, upcoming  appointments, etc.  Non-urgent messages can be sent to your provider as well.   To learn more about what you can do with MyChart, go to [email or web address].

## 2024-09-15 ENCOUNTER — Other Ambulatory Visit: Payer: Self-pay | Admitting: Cardiology

## 2024-09-16 ENCOUNTER — Other Ambulatory Visit: Payer: Self-pay | Admitting: Cardiology

## 2024-09-28 ENCOUNTER — Encounter (HOSPITAL_COMMUNITY): Payer: Self-pay

## 2024-10-02 ENCOUNTER — Ambulatory Visit (HOSPITAL_COMMUNITY)
